# Patient Record
Sex: Male | Born: 1935 | Race: White | Hispanic: No
Health system: Southern US, Community
[De-identification: ages and names within clinical notes are randomized; demographics above are authoritative.]

## PROBLEM LIST (undated history)

## (undated) DIAGNOSIS — R2689 Other abnormalities of gait and mobility: Secondary | ICD-10-CM

## (undated) DIAGNOSIS — U071 COVID-19: Secondary | ICD-10-CM

## (undated) DIAGNOSIS — R627 Adult failure to thrive: Secondary | ICD-10-CM

## (undated) DIAGNOSIS — R339 Retention of urine, unspecified: Secondary | ICD-10-CM

## (undated) DIAGNOSIS — R1310 Dysphagia, unspecified: Secondary | ICD-10-CM

## (undated) DIAGNOSIS — M6281 Muscle weakness (generalized): Secondary | ICD-10-CM

## (undated) DIAGNOSIS — E119 Type 2 diabetes mellitus without complications: Secondary | ICD-10-CM

## (undated) DIAGNOSIS — R41841 Cognitive communication deficit: Secondary | ICD-10-CM

## (undated) DIAGNOSIS — J9601 Acute respiratory failure with hypoxia: Secondary | ICD-10-CM

## (undated) DIAGNOSIS — Q600 Renal agenesis, unilateral: Secondary | ICD-10-CM

## (undated) DIAGNOSIS — I10 Essential (primary) hypertension: Secondary | ICD-10-CM

---

## 2005-04-18 ENCOUNTER — Ambulatory Visit: Admission: RE | Admit: 2005-04-18 | Discharge: 2005-04-18 | Payer: Self-pay | Admitting: Ophthalmology

## 2005-08-10 ENCOUNTER — Ambulatory Visit (HOSPITAL_COMMUNITY): Admission: RE | Admit: 2005-08-10 | Discharge: 2005-08-10 | Payer: Self-pay | Admitting: Ophthalmology

## 2006-10-30 ENCOUNTER — Ambulatory Visit (HOSPITAL_COMMUNITY): Admission: RE | Admit: 2006-10-30 | Discharge: 2006-10-30 | Payer: Self-pay | Admitting: Ophthalmology

## 2006-10-30 ENCOUNTER — Encounter (INDEPENDENT_AMBULATORY_CARE_PROVIDER_SITE_OTHER): Payer: Self-pay | Admitting: Ophthalmology

## 2010-06-15 NOTE — Op Note (Signed)
NAME:  Joe [name], Joe [name]             ACCOUNT NO.:  [id number]   MEDICAL RECORD NO.:  [id number]          PATIENT TYPE:  AMB   LOCATION:  DFTL                         FACILITY:  [facility]   PHYSICIAN:  Alford Highland. Rankin, M.D.   DATE OF BIRTH:  [birth year]   DATE OF PROCEDURE:  10/30/2006  DATE OF DISCHARGE:                               OPERATIVE REPORT   SURGEON:  Jillyn Hidden A. Rankin, M.D.   PREOPERATIVE DIAGNOSIS:  Dislocated intraocular lens, posterior-  vitreous, left eye.   POSTOPERATIVE DIAGNOSIS:  Dislocated intraocular lens, posterior-  vitreous, left eye.   PROCEDURES PERFORMED:  1. Posterior vitrectomy, left eye.  2. Removal of dislocated implant-intraocular lens into the vitreous      cavity.  3. Insertion of anterior chamber intraocular lens, secondarily, left      eye.  4. Superior peripheral iridectomy.   LENS INFORMATION:  Lens model:  Alcon MTA4U0, power plus 18.  Serial #  [id number].   ANESTHESIA:  Local with monitored anesthesia control.   INDICATIONS FOR PROCEDURE:  The patient is a 77 year old man who is  aphakic with profound vision loss on the basis of unfocused images on  the retina from dislocated intraocular lens.  This is an attempt to  remove the posterior chamber intraocular lens which has inadequate  posterior capsular support in order to deliver definitive visual  restoration.  The patient understands the risks of anesthesia including  MI, stroke, death; also to the eye including, but not limited to, the  underlying condition and attempted surgical repair, hemorrhage,  infection, scarring, need for further surgery, no change in vision, loss  of vision, progressive disease despite intervention.  Appropriate signed  consent was obtained and the patient taken to the operating room.   DESCRIPTION OF PROCEDURE:  In the operating room, appropriate monitors  followed by mild sedation.  Marcaine, 0.75%, delivered, 5 mL  retrobulbar, followed by additional 5 mL;  the latter in the fashion of a  modified Darel Hong.  The left periocular region was sterilely prepped and  draped in the usual ophthalmic fashion.  A lid speculum applied.  A 25-  gauge trocar placed in the inferotemporal quadrant.  The infusion was  then turned on.  The superior trocar was applied.  Conjunctival peritomy  was then fashioned superiorly.  A grooved limbal incision was fashioned  with the crescent blade.  Then 25-gauge forceps were used with an  attachment  on the microscope visualization system to grasp the  intraocular lens which was dangling from the 6 o'clock capsule into the  posterior vitreous.  The lens was grasped, the optic was grasped and  brought to pars plana.  A 20-gauge anterior chamber is then opened  through the grooved limbal incision.  Then 20-gauge forceps were used to  grasp the intraocular lens.  The anterior chamber was deepened further  with Viscoat.  The grooved limbal incision was opened to allow egress of  the intraocular lens.  At this time, the anterior chamber lens was  placed into the anterior chamber under low infusion pressures.  This was  rotated in the  horizontal position and appropriately centered.  Superior  peripheral iridectomy was fashioned with Vannas scissors.  The limbus  was then closed with interrupted 10-0 nylon sutures.  Hemostasis was  spontaneous.  It must be noted that plugs have been placed in the  trocars during all these portions of the procedure.  Now the plugs are  removed and careful inspection found the retina is attached.  No  complications occurred.  High infusion pressures are necessary because  of the spontaneous scleral collapse because of orbital pressures.  These  high pressures were allowed.  Also the Viscoat was allowed to stay in  the anterior chamber to combat the scleral collapse from the high  orbital pressures.  The wound was secure.  The trocar was removed under  high infusion pressures.  The wounds were  secure.  The infusion removed.  Subconjunctival Decadron applied.  A sterile patch and Fox shield  applied.  The patient was taken to the short stay and will be discharged  home as an outpatient.      Alford Highland Rankin, M.D.  Electronically Signed     GAR/MEDQ  D:  10/30/2006  T:  10/31/2006  Job:  [id number]

## 2010-06-18 NOTE — Op Note (Signed)
NAME:  Joe [name], Joe [name]             ACCOUNT NO.:  [id number]   MEDICAL RECORD NO.:  [id number]          PATIENT TYPE:  AMB   LOCATION:  DFTL                         FACILITY:  [facility]   PHYSICIAN:  Alford Highland. Rankin, M.D.   DATE OF BIRTH:  02-Mar-1926   DATE OF PROCEDURE:  04/18/2005  DATE OF DISCHARGE:                                 OPERATIVE REPORT   PREOPERATIVE DIAGNOSES:  1.  Retained lens fragments, left eye.  2.  Aphakia, left eye.   POSTOPERATIVE DIAGNOSES:  1.  Retained lens fragments, left eye.  2.  Aphakia, left eye.   OPERATION PERFORMED:  1.  Posterior vitrectomy of the left eye.  2.  Removal of nonmagnetic foreign body, left eye, lens fragments.  3.  Insertion of posterior chamber intraocular lens secondary in the sulcus.      This is an Scientific laboratory technician.  Power is +22.0.   SURGEON:  Alford Highland. Rankin, M.D.   ANESTHESIA:  Local retrobulbar with monitored anesthesia control.   INDICATIONS FOR PROCEDURE:  The patient is a 77 year old man who had earlier  today complicated cataract surgery with Dr. Vonna Kotyk, and was found to have  ruptured capsule, dislocation of posterior chamber intraocular lens  fragments, numerous and sizable, as well as being aphakic.  The patient was  found to require surgical intervention. The patient understands the need for  the procedure to restore pseudophakic condition as well as to remove the  lens fragments because of risk of inflammation and side effects of glaucoma.  The patient understands the risks of anesthesia including the rare  occurrence of death but also to the eye including but not limited to  hemorrhage, infection, scarring, need for another surgery, no change in  vision, loss of vision and progression of disease despite intervention.  After appropriate signed consent was obtained, the patient was taken to the  operating room.   DESCRIPTION OF PROCEDURE:  In the operating room, appropriate monitoring was  followed by mild  sedation.  0.75% Marcaine was delivered 5 mL retrobulbar  followed by an additional 5 mL laterally in the fashion of modified Darel Hong.  The left periocular region was sterilely prepped and draped in the  usual ophthalmic fashion.  A lid speculum was applied.  Conjunctival  peritomy was fashioned superiorly and temporally.  The infusion secured in  the infratemporal quadrant.  Placement in the vitreous cavity verified  visually.  Superior sclerotomies then fashioned.  Microscope was placed in  position with the Biom attachment.  Core vitrectomy was then begun.  Numerous large fragments and cortical fragments were noted in the vitreous  cavity and these were removed without difficulty.  Scleral depression was  then used to trim the vitreous base and to confirm there were no retinal  holes or tears.  At this time the Kuglen hook was then used to retract the  inferior iris and to confirm that there were sizable remnants of the  posterior peripheral capsule.  Large peripheral capsule was noted  superiorly.  There was a dehiscence in the capsule between the 8 o'clock and  10 o'clock positions.   For this reason, the previous clear corneal incision which had been made and  closed previously with two previous nylon sutures was then used and enlarged  with keratome.  Sutures removed.  Through this area, the lens was inserted  into the anterior chamber and then this was placed into the appropriate  position and oriented from the 11 o'clock to the 7 o'clock position.  There  was excellent capsule support, excellent screen back.  There was no sign of  spiraling.   At this time the anterior chamber was gently further deepened again as  previous to the insertion of the lens with Viscoat. The clear corneal  incision was then closed with interrupted 10-0 nylon sutures.  These were  left unburied so as to facilitate removal later.  The wound was checked and found to be secure.  Superior sclerotomies  were  closed with 7-0 Vicryl.  The infusion removed and similarly closed.  Conjunctiva closed with 7-0 Vicryl.  Subconjunctival injection of antibiotic  and steroid applied.  A sterile patch and Fox shield were applied. The  patient tolerated the procedure well without complication.      Alford Highland Rankin, M.D.  Electronically Signed     GAR/MEDQ  D:  04/18/2005  T:  04/19/2005  Job:  [id number]   cc:   Ozzie Hoyle. Vonna Kotyk, MD  [address]  [place], [place] [address]   Allen Norris, M.D.  Fax: [phone]

## 2010-06-18 NOTE — Op Note (Signed)
NAME:  ELISAH, PARMER             ACCOUNT NO.:  [id number]   MEDICAL RECORD NO.:  [id number]          PATIENT TYPE:  AMB   LOCATION:  SDS                          FACILITY:  [facility]   PHYSICIAN:  Jillyn Hidden A. Rankin, M.D.   DATE OF BIRTH:  Jul 19, 1926   DATE OF PROCEDURE:  08/10/2005  DATE OF DISCHARGE:                                 OPERATIVE REPORT   PREOPERATIVE DIAGNOSIS:  Dislocated posterior chamber intraocular lens left  eye.   POSTOPERATIVE DIAGNOSES:  1.  Same.  2.  Aphakia left eye.   PROCEDURE:  1.  Post iridectomy -25 gauge left eye.  2.  Repositioning of intraocular lens into the sulcus with excellent support      from the 11-5:00 [place].   SURGEON:  Alford Highland. Rankin, M.D.   ANESTHESIA:  Local via retrobulbar.   INDICATIONS FOR PROCEDURE:  The patient is a 77 year old man who has vision  loss based on dislocated and posterior chamber intraocular lens.  The  patient understands this is an attempt to reposition.  He knows we will  potentially explant and remove it and place a different style lens based  upon the amount of support left in place.  He understands the risks of  anesthesia in regard to the loss of the eye including but not limited to  hemorrhage, scarring, need of other surgery, no change in vision, loss of  vision, disease by intervention. Informed consent was obtained.  The patient  was taken to the operating room.  In the operating room appropriate monitors  were placed following mild sedation. Next,  Marcaine 5 cc retrobulbar for  additional 5 cc laterally in the modified [employer]  The left ocular region  was sterilely prepped and draped in the usual fashion.  The speculum  applied.  Conjunctiva was noted ____25 gauge case.  Trocar was placed in the  upper quadrant.  Infusion turned on.  Superior trocar was applied.  Paracentesis incision made at 12:00 with a 20 gauge MBR blade.  Previous  vitreous had been removed.  25 gauge membrane forceps were then  used to  grasp the optic of the intraocular lens which was dislocated inferiorly to  bring it into the anterior chamber.  The anterior chamber deepened with  Provisc.  At this time 20 gauge forceps were then used across the anterior  chamber to reposition the lens and then to rotate it back into  position.  There was excellent capsular support inferior, intranasally and  infratemporally.  Central capsule support appeared to be all that was  missing.  Because of this the decision was made to rotate the lens into  sulcus.  Sulcus appeared to have excellent support.  There did not appear to  be any capsule dehiscence.  There is excellent spring back test laterally.   At this time visualization of the posterior sac was confirmed. There were no  retinal holes or tears.  No complications occurred.  Silk patch Fox shield  applied.  Subconjunctival dressing and Decadron was re-applied  subconjunctivally.  The patient was taken back to the short stay area  and  discharged to home as an outpatient.      Alford Highland Rankin, M.D.  Electronically Signed     GAR/MEDQ  D:  08/10/2005  T:  08/11/2005  Job:  [id number]

## 2010-11-11 LAB — PROTIME-INR
INR: 0.9
Prothrombin Time: 12.8

## 2010-11-11 LAB — BASIC METABOLIC PANEL
BUN: 15
CO2: 23
GFR calc non Af Amer: >60
Glucose, Bld: 115 — ABNORMAL HIGH
Sodium: 138

## 2010-11-11 LAB — CBC
Hemoglobin: 15.8
RDW: 14.5 — ABNORMAL HIGH
WBC: 6.1

## 2010-11-11 LAB — APTT: aPTT: 35

## 2011-03-10 DIAGNOSIS — E7800 Pure hypercholesterolemia, unspecified: Secondary | ICD-10-CM | POA: Diagnosis not present

## 2011-03-10 DIAGNOSIS — R7309 Other abnormal glucose: Secondary | ICD-10-CM | POA: Diagnosis not present

## 2011-03-10 DIAGNOSIS — I10 Essential (primary) hypertension: Secondary | ICD-10-CM | POA: Diagnosis not present

## 2011-06-08 DIAGNOSIS — M1710 Unilateral primary osteoarthritis, unspecified knee: Secondary | ICD-10-CM | POA: Diagnosis not present

## 2011-06-08 DIAGNOSIS — I10 Essential (primary) hypertension: Secondary | ICD-10-CM | POA: Diagnosis not present

## 2011-06-08 DIAGNOSIS — E7800 Pure hypercholesterolemia, unspecified: Secondary | ICD-10-CM | POA: Diagnosis not present

## 2011-06-08 DIAGNOSIS — R7309 Other abnormal glucose: Secondary | ICD-10-CM | POA: Diagnosis not present

## 2011-09-26 DIAGNOSIS — R7309 Other abnormal glucose: Secondary | ICD-10-CM | POA: Diagnosis not present

## 2011-09-26 DIAGNOSIS — E7800 Pure hypercholesterolemia, unspecified: Secondary | ICD-10-CM | POA: Diagnosis not present

## 2011-09-26 DIAGNOSIS — I10 Essential (primary) hypertension: Secondary | ICD-10-CM | POA: Diagnosis not present

## 2011-09-26 DIAGNOSIS — M1710 Unilateral primary osteoarthritis, unspecified knee: Secondary | ICD-10-CM | POA: Diagnosis not present

## 2012-01-09 DIAGNOSIS — E782 Mixed hyperlipidemia: Secondary | ICD-10-CM | POA: Diagnosis not present

## 2012-01-09 DIAGNOSIS — I10 Essential (primary) hypertension: Secondary | ICD-10-CM | POA: Diagnosis not present

## 2012-01-09 DIAGNOSIS — R7309 Other abnormal glucose: Secondary | ICD-10-CM | POA: Diagnosis not present

## 2012-01-09 DIAGNOSIS — Z79899 Other long term (current) drug therapy: Secondary | ICD-10-CM | POA: Diagnosis not present

## 2012-01-09 DIAGNOSIS — E785 Hyperlipidemia, unspecified: Secondary | ICD-10-CM | POA: Diagnosis not present

## 2012-01-09 DIAGNOSIS — E119 Type 2 diabetes mellitus without complications: Secondary | ICD-10-CM | POA: Diagnosis not present

## 2012-07-09 DIAGNOSIS — E785 Hyperlipidemia, unspecified: Secondary | ICD-10-CM | POA: Diagnosis not present

## 2012-07-09 DIAGNOSIS — I10 Essential (primary) hypertension: Secondary | ICD-10-CM | POA: Diagnosis not present

## 2012-07-09 DIAGNOSIS — E119 Type 2 diabetes mellitus without complications: Secondary | ICD-10-CM | POA: Diagnosis not present

## 2012-10-10 DIAGNOSIS — R748 Abnormal levels of other serum enzymes: Secondary | ICD-10-CM | POA: Diagnosis not present

## 2012-10-10 DIAGNOSIS — I10 Essential (primary) hypertension: Secondary | ICD-10-CM | POA: Diagnosis not present

## 2012-10-10 DIAGNOSIS — E119 Type 2 diabetes mellitus without complications: Secondary | ICD-10-CM | POA: Diagnosis not present

## 2012-10-10 DIAGNOSIS — E785 Hyperlipidemia, unspecified: Secondary | ICD-10-CM | POA: Diagnosis not present

## 2013-05-08 DIAGNOSIS — E785 Hyperlipidemia, unspecified: Secondary | ICD-10-CM | POA: Diagnosis not present

## 2013-05-08 DIAGNOSIS — I10 Essential (primary) hypertension: Secondary | ICD-10-CM | POA: Diagnosis not present

## 2013-05-08 DIAGNOSIS — E119 Type 2 diabetes mellitus without complications: Secondary | ICD-10-CM | POA: Diagnosis not present

## 2013-05-08 DIAGNOSIS — R748 Abnormal levels of other serum enzymes: Secondary | ICD-10-CM | POA: Diagnosis not present

## 2013-07-20 DIAGNOSIS — M129 Arthropathy, unspecified: Secondary | ICD-10-CM | POA: Diagnosis not present

## 2013-07-20 DIAGNOSIS — M1710 Unilateral primary osteoarthritis, unspecified knee: Secondary | ICD-10-CM | POA: Diagnosis not present

## 2013-07-20 DIAGNOSIS — M79609 Pain in unspecified limb: Secondary | ICD-10-CM | POA: Diagnosis not present

## 2013-07-20 DIAGNOSIS — M25869 Other specified joint disorders, unspecified knee: Secondary | ICD-10-CM | POA: Diagnosis not present

## 2013-07-20 DIAGNOSIS — M25569 Pain in unspecified knee: Secondary | ICD-10-CM | POA: Diagnosis not present

## 2013-07-20 DIAGNOSIS — M25559 Pain in unspecified hip: Secondary | ICD-10-CM | POA: Diagnosis not present

## 2013-07-20 DIAGNOSIS — M25669 Stiffness of unspecified knee, not elsewhere classified: Secondary | ICD-10-CM | POA: Diagnosis not present

## 2013-07-21 DIAGNOSIS — M25559 Pain in unspecified hip: Secondary | ICD-10-CM | POA: Diagnosis not present

## 2013-07-21 DIAGNOSIS — M25869 Other specified joint disorders, unspecified knee: Secondary | ICD-10-CM | POA: Diagnosis not present

## 2013-07-24 DIAGNOSIS — B029 Zoster without complications: Secondary | ICD-10-CM | POA: Diagnosis not present

## 2013-07-31 DIAGNOSIS — L02419 Cutaneous abscess of limb, unspecified: Secondary | ICD-10-CM | POA: Diagnosis not present

## 2013-08-05 DIAGNOSIS — E1129 Type 2 diabetes mellitus with other diabetic kidney complication: Secondary | ICD-10-CM | POA: Diagnosis present

## 2013-08-05 DIAGNOSIS — R918 Other nonspecific abnormal finding of lung field: Secondary | ICD-10-CM | POA: Diagnosis not present

## 2013-08-05 DIAGNOSIS — N179 Acute kidney failure, unspecified: Secondary | ICD-10-CM | POA: Diagnosis not present

## 2013-08-05 DIAGNOSIS — E119 Type 2 diabetes mellitus without complications: Secondary | ICD-10-CM | POA: Diagnosis not present

## 2013-08-05 DIAGNOSIS — J189 Pneumonia, unspecified organism: Secondary | ICD-10-CM | POA: Diagnosis not present

## 2013-08-05 DIAGNOSIS — Z6833 Body mass index (BMI) 33.0-33.9, adult: Secondary | ICD-10-CM | POA: Diagnosis not present

## 2013-08-05 DIAGNOSIS — R5381 Other malaise: Secondary | ICD-10-CM | POA: Diagnosis not present

## 2013-08-05 DIAGNOSIS — IMO0001 Reserved for inherently not codable concepts without codable children: Secondary | ICD-10-CM | POA: Diagnosis not present

## 2013-08-05 DIAGNOSIS — M19049 Primary osteoarthritis, unspecified hand: Secondary | ICD-10-CM | POA: Diagnosis present

## 2013-08-05 DIAGNOSIS — Q605 Renal hypoplasia, unspecified: Secondary | ICD-10-CM | POA: Diagnosis not present

## 2013-08-05 DIAGNOSIS — Z79899 Other long term (current) drug therapy: Secondary | ICD-10-CM | POA: Diagnosis not present

## 2013-08-05 DIAGNOSIS — R5383 Other fatigue: Secondary | ICD-10-CM | POA: Diagnosis not present

## 2013-08-05 DIAGNOSIS — N058 Unspecified nephritic syndrome with other morphologic changes: Secondary | ICD-10-CM | POA: Diagnosis present

## 2013-08-05 DIAGNOSIS — B028 Zoster with other complications: Secondary | ICD-10-CM | POA: Diagnosis not present

## 2013-08-05 DIAGNOSIS — E1165 Type 2 diabetes mellitus with hyperglycemia: Secondary | ICD-10-CM | POA: Diagnosis present

## 2013-08-05 DIAGNOSIS — Q602 Renal agenesis, unspecified: Secondary | ICD-10-CM | POA: Diagnosis not present

## 2013-08-05 DIAGNOSIS — E875 Hyperkalemia: Secondary | ICD-10-CM | POA: Diagnosis not present

## 2013-08-05 DIAGNOSIS — R404 Transient alteration of awareness: Secondary | ICD-10-CM | POA: Diagnosis not present

## 2013-08-05 DIAGNOSIS — Z7982 Long term (current) use of aspirin: Secondary | ICD-10-CM | POA: Diagnosis not present

## 2013-08-05 DIAGNOSIS — I10 Essential (primary) hypertension: Secondary | ICD-10-CM | POA: Diagnosis not present

## 2013-08-05 DIAGNOSIS — K219 Gastro-esophageal reflux disease without esophagitis: Secondary | ICD-10-CM | POA: Diagnosis present

## 2013-08-05 DIAGNOSIS — B029 Zoster without complications: Secondary | ICD-10-CM | POA: Diagnosis not present

## 2013-08-05 DIAGNOSIS — E7800 Pure hypercholesterolemia, unspecified: Secondary | ICD-10-CM | POA: Diagnosis present

## 2013-08-05 DIAGNOSIS — B372 Candidiasis of skin and nail: Secondary | ICD-10-CM | POA: Diagnosis not present

## 2013-08-05 DIAGNOSIS — R112 Nausea with vomiting, unspecified: Secondary | ICD-10-CM | POA: Diagnosis not present

## 2013-08-05 DIAGNOSIS — E785 Hyperlipidemia, unspecified: Secondary | ICD-10-CM | POA: Diagnosis not present

## 2013-08-08 DIAGNOSIS — L8991 Pressure ulcer of unspecified site, stage 1: Secondary | ICD-10-CM | POA: Diagnosis not present

## 2013-08-08 DIAGNOSIS — L89109 Pressure ulcer of unspecified part of back, unspecified stage: Secondary | ICD-10-CM | POA: Diagnosis not present

## 2013-08-08 DIAGNOSIS — E119 Type 2 diabetes mellitus without complications: Secondary | ICD-10-CM | POA: Diagnosis not present

## 2013-08-08 DIAGNOSIS — R5381 Other malaise: Secondary | ICD-10-CM | POA: Diagnosis not present

## 2013-08-08 DIAGNOSIS — R5383 Other fatigue: Secondary | ICD-10-CM | POA: Diagnosis not present

## 2013-08-08 DIAGNOSIS — B029 Zoster without complications: Secondary | ICD-10-CM | POA: Diagnosis not present

## 2013-08-08 DIAGNOSIS — I10 Essential (primary) hypertension: Secondary | ICD-10-CM | POA: Diagnosis not present

## 2013-08-12 DIAGNOSIS — Z6831 Body mass index (BMI) 31.0-31.9, adult: Secondary | ICD-10-CM | POA: Diagnosis not present

## 2013-08-12 DIAGNOSIS — N179 Acute kidney failure, unspecified: Secondary | ICD-10-CM | POA: Diagnosis not present

## 2013-08-12 DIAGNOSIS — R339 Retention of urine, unspecified: Secondary | ICD-10-CM | POA: Diagnosis not present

## 2013-08-12 DIAGNOSIS — N390 Urinary tract infection, site not specified: Secondary | ICD-10-CM | POA: Diagnosis not present

## 2013-08-12 DIAGNOSIS — E875 Hyperkalemia: Secondary | ICD-10-CM | POA: Diagnosis not present

## 2013-08-12 DIAGNOSIS — N318 Other neuromuscular dysfunction of bladder: Secondary | ICD-10-CM | POA: Diagnosis not present

## 2013-08-12 DIAGNOSIS — R338 Other retention of urine: Secondary | ICD-10-CM | POA: Diagnosis not present

## 2013-08-12 DIAGNOSIS — N402 Nodular prostate without lower urinary tract symptoms: Secondary | ICD-10-CM | POA: Diagnosis not present

## 2013-08-12 DIAGNOSIS — E860 Dehydration: Secondary | ICD-10-CM | POA: Diagnosis not present

## 2013-08-13 DIAGNOSIS — R5381 Other malaise: Secondary | ICD-10-CM | POA: Diagnosis not present

## 2013-08-13 DIAGNOSIS — E119 Type 2 diabetes mellitus without complications: Secondary | ICD-10-CM | POA: Diagnosis not present

## 2013-08-13 DIAGNOSIS — L8991 Pressure ulcer of unspecified site, stage 1: Secondary | ICD-10-CM | POA: Diagnosis not present

## 2013-08-13 DIAGNOSIS — B029 Zoster without complications: Secondary | ICD-10-CM | POA: Diagnosis not present

## 2013-08-13 DIAGNOSIS — I10 Essential (primary) hypertension: Secondary | ICD-10-CM | POA: Diagnosis not present

## 2013-08-13 DIAGNOSIS — L89109 Pressure ulcer of unspecified part of back, unspecified stage: Secondary | ICD-10-CM | POA: Diagnosis not present

## 2013-08-13 DIAGNOSIS — R5383 Other fatigue: Secondary | ICD-10-CM | POA: Diagnosis not present

## 2013-08-15 DIAGNOSIS — R5381 Other malaise: Secondary | ICD-10-CM | POA: Diagnosis not present

## 2013-08-15 DIAGNOSIS — L89109 Pressure ulcer of unspecified part of back, unspecified stage: Secondary | ICD-10-CM | POA: Diagnosis not present

## 2013-08-15 DIAGNOSIS — L8991 Pressure ulcer of unspecified site, stage 1: Secondary | ICD-10-CM | POA: Diagnosis not present

## 2013-08-15 DIAGNOSIS — E119 Type 2 diabetes mellitus without complications: Secondary | ICD-10-CM | POA: Diagnosis not present

## 2013-08-15 DIAGNOSIS — B029 Zoster without complications: Secondary | ICD-10-CM | POA: Diagnosis not present

## 2013-08-15 DIAGNOSIS — I10 Essential (primary) hypertension: Secondary | ICD-10-CM | POA: Diagnosis not present

## 2013-08-15 DIAGNOSIS — R5383 Other fatigue: Secondary | ICD-10-CM | POA: Diagnosis not present

## 2013-08-16 DIAGNOSIS — R5381 Other malaise: Secondary | ICD-10-CM | POA: Diagnosis not present

## 2013-08-16 DIAGNOSIS — L8991 Pressure ulcer of unspecified site, stage 1: Secondary | ICD-10-CM | POA: Diagnosis not present

## 2013-08-16 DIAGNOSIS — L89109 Pressure ulcer of unspecified part of back, unspecified stage: Secondary | ICD-10-CM | POA: Diagnosis not present

## 2013-08-16 DIAGNOSIS — B029 Zoster without complications: Secondary | ICD-10-CM | POA: Diagnosis not present

## 2013-08-16 DIAGNOSIS — I10 Essential (primary) hypertension: Secondary | ICD-10-CM | POA: Diagnosis not present

## 2013-08-16 DIAGNOSIS — E119 Type 2 diabetes mellitus without complications: Secondary | ICD-10-CM | POA: Diagnosis not present

## 2013-08-19 DIAGNOSIS — N138 Other obstructive and reflux uropathy: Secondary | ICD-10-CM | POA: Diagnosis not present

## 2013-08-19 DIAGNOSIS — R339 Retention of urine, unspecified: Secondary | ICD-10-CM | POA: Diagnosis not present

## 2013-08-19 DIAGNOSIS — N403 Nodular prostate with lower urinary tract symptoms: Secondary | ICD-10-CM | POA: Diagnosis not present

## 2013-08-21 DIAGNOSIS — I10 Essential (primary) hypertension: Secondary | ICD-10-CM | POA: Diagnosis not present

## 2013-08-21 DIAGNOSIS — B029 Zoster without complications: Secondary | ICD-10-CM | POA: Diagnosis not present

## 2013-08-21 DIAGNOSIS — R5381 Other malaise: Secondary | ICD-10-CM | POA: Diagnosis not present

## 2013-08-21 DIAGNOSIS — L89109 Pressure ulcer of unspecified part of back, unspecified stage: Secondary | ICD-10-CM | POA: Diagnosis not present

## 2013-08-21 DIAGNOSIS — R5383 Other fatigue: Secondary | ICD-10-CM | POA: Diagnosis not present

## 2013-08-21 DIAGNOSIS — L8991 Pressure ulcer of unspecified site, stage 1: Secondary | ICD-10-CM | POA: Diagnosis not present

## 2013-08-21 DIAGNOSIS — E119 Type 2 diabetes mellitus without complications: Secondary | ICD-10-CM | POA: Diagnosis not present

## 2013-08-23 DIAGNOSIS — I10 Essential (primary) hypertension: Secondary | ICD-10-CM | POA: Diagnosis not present

## 2013-08-23 DIAGNOSIS — B029 Zoster without complications: Secondary | ICD-10-CM | POA: Diagnosis not present

## 2013-08-23 DIAGNOSIS — E119 Type 2 diabetes mellitus without complications: Secondary | ICD-10-CM | POA: Diagnosis not present

## 2013-08-23 DIAGNOSIS — R5381 Other malaise: Secondary | ICD-10-CM | POA: Diagnosis not present

## 2013-08-23 DIAGNOSIS — L89109 Pressure ulcer of unspecified part of back, unspecified stage: Secondary | ICD-10-CM | POA: Diagnosis not present

## 2013-08-23 DIAGNOSIS — L8991 Pressure ulcer of unspecified site, stage 1: Secondary | ICD-10-CM | POA: Diagnosis not present

## 2013-08-23 DIAGNOSIS — R5383 Other fatigue: Secondary | ICD-10-CM | POA: Diagnosis not present

## 2013-08-27 DIAGNOSIS — N401 Enlarged prostate with lower urinary tract symptoms: Secondary | ICD-10-CM | POA: Diagnosis not present

## 2013-08-27 DIAGNOSIS — I10 Essential (primary) hypertension: Secondary | ICD-10-CM | POA: Diagnosis not present

## 2013-08-27 DIAGNOSIS — B029 Zoster without complications: Secondary | ICD-10-CM | POA: Diagnosis not present

## 2013-08-27 DIAGNOSIS — Z6832 Body mass index (BMI) 32.0-32.9, adult: Secondary | ICD-10-CM | POA: Diagnosis not present

## 2013-08-27 DIAGNOSIS — IMO0001 Reserved for inherently not codable concepts without codable children: Secondary | ICD-10-CM | POA: Diagnosis not present

## 2013-08-27 DIAGNOSIS — N138 Other obstructive and reflux uropathy: Secondary | ICD-10-CM | POA: Diagnosis not present

## 2013-08-27 DIAGNOSIS — N179 Acute kidney failure, unspecified: Secondary | ICD-10-CM | POA: Diagnosis not present

## 2013-08-28 DIAGNOSIS — L89109 Pressure ulcer of unspecified part of back, unspecified stage: Secondary | ICD-10-CM | POA: Diagnosis not present

## 2013-08-28 DIAGNOSIS — I10 Essential (primary) hypertension: Secondary | ICD-10-CM | POA: Diagnosis not present

## 2013-08-28 DIAGNOSIS — R5381 Other malaise: Secondary | ICD-10-CM | POA: Diagnosis not present

## 2013-08-28 DIAGNOSIS — E119 Type 2 diabetes mellitus without complications: Secondary | ICD-10-CM | POA: Diagnosis not present

## 2013-08-28 DIAGNOSIS — L8991 Pressure ulcer of unspecified site, stage 1: Secondary | ICD-10-CM | POA: Diagnosis not present

## 2013-08-28 DIAGNOSIS — B029 Zoster without complications: Secondary | ICD-10-CM | POA: Diagnosis not present

## 2013-09-05 DIAGNOSIS — E119 Type 2 diabetes mellitus without complications: Secondary | ICD-10-CM | POA: Diagnosis not present

## 2013-09-05 DIAGNOSIS — L89109 Pressure ulcer of unspecified part of back, unspecified stage: Secondary | ICD-10-CM | POA: Diagnosis not present

## 2013-09-05 DIAGNOSIS — B029 Zoster without complications: Secondary | ICD-10-CM | POA: Diagnosis not present

## 2013-09-05 DIAGNOSIS — I10 Essential (primary) hypertension: Secondary | ICD-10-CM | POA: Diagnosis not present

## 2013-09-05 DIAGNOSIS — R5381 Other malaise: Secondary | ICD-10-CM | POA: Diagnosis not present

## 2013-09-05 DIAGNOSIS — L8991 Pressure ulcer of unspecified site, stage 1: Secondary | ICD-10-CM | POA: Diagnosis not present

## 2013-09-11 DIAGNOSIS — L8991 Pressure ulcer of unspecified site, stage 1: Secondary | ICD-10-CM | POA: Diagnosis not present

## 2013-09-11 DIAGNOSIS — L89109 Pressure ulcer of unspecified part of back, unspecified stage: Secondary | ICD-10-CM | POA: Diagnosis not present

## 2013-09-11 DIAGNOSIS — I10 Essential (primary) hypertension: Secondary | ICD-10-CM | POA: Diagnosis not present

## 2013-09-11 DIAGNOSIS — B029 Zoster without complications: Secondary | ICD-10-CM | POA: Diagnosis not present

## 2013-09-11 DIAGNOSIS — R5381 Other malaise: Secondary | ICD-10-CM | POA: Diagnosis not present

## 2013-09-11 DIAGNOSIS — E119 Type 2 diabetes mellitus without complications: Secondary | ICD-10-CM | POA: Diagnosis not present

## 2013-09-26 DIAGNOSIS — E119 Type 2 diabetes mellitus without complications: Secondary | ICD-10-CM | POA: Diagnosis not present

## 2013-09-26 DIAGNOSIS — B0229 Other postherpetic nervous system involvement: Secondary | ICD-10-CM | POA: Diagnosis not present

## 2013-09-26 DIAGNOSIS — Z6832 Body mass index (BMI) 32.0-32.9, adult: Secondary | ICD-10-CM | POA: Diagnosis not present

## 2013-10-31 DIAGNOSIS — E785 Hyperlipidemia, unspecified: Secondary | ICD-10-CM | POA: Diagnosis not present

## 2013-10-31 DIAGNOSIS — E119 Type 2 diabetes mellitus without complications: Secondary | ICD-10-CM | POA: Diagnosis not present

## 2013-10-31 DIAGNOSIS — Z6832 Body mass index (BMI) 32.0-32.9, adult: Secondary | ICD-10-CM | POA: Diagnosis not present

## 2013-10-31 DIAGNOSIS — C44229 Squamous cell carcinoma of skin of left ear and external auricular canal: Secondary | ICD-10-CM | POA: Diagnosis not present

## 2013-10-31 DIAGNOSIS — I10 Essential (primary) hypertension: Secondary | ICD-10-CM | POA: Diagnosis not present

## 2013-10-31 DIAGNOSIS — N401 Enlarged prostate with lower urinary tract symptoms: Secondary | ICD-10-CM | POA: Diagnosis not present

## 2013-11-18 DIAGNOSIS — C44219 Basal cell carcinoma of skin of left ear and external auricular canal: Secondary | ICD-10-CM | POA: Diagnosis not present

## 2013-12-18 DIAGNOSIS — C44229 Squamous cell carcinoma of skin of left ear and external auricular canal: Secondary | ICD-10-CM | POA: Diagnosis not present

## 2014-03-19 DIAGNOSIS — N401 Enlarged prostate with lower urinary tract symptoms: Secondary | ICD-10-CM | POA: Diagnosis not present

## 2014-03-19 DIAGNOSIS — E119 Type 2 diabetes mellitus without complications: Secondary | ICD-10-CM | POA: Diagnosis not present

## 2014-03-19 DIAGNOSIS — E785 Hyperlipidemia, unspecified: Secondary | ICD-10-CM | POA: Diagnosis not present

## 2014-03-19 DIAGNOSIS — I10 Essential (primary) hypertension: Secondary | ICD-10-CM | POA: Diagnosis not present

## 2014-03-19 DIAGNOSIS — Z6834 Body mass index (BMI) 34.0-34.9, adult: Secondary | ICD-10-CM | POA: Diagnosis not present

## 2014-03-20 DIAGNOSIS — E119 Type 2 diabetes mellitus without complications: Secondary | ICD-10-CM | POA: Diagnosis not present

## 2014-09-15 DIAGNOSIS — I10 Essential (primary) hypertension: Secondary | ICD-10-CM | POA: Diagnosis not present

## 2014-09-15 DIAGNOSIS — E785 Hyperlipidemia, unspecified: Secondary | ICD-10-CM | POA: Diagnosis not present

## 2014-09-15 DIAGNOSIS — Z6835 Body mass index (BMI) 35.0-35.9, adult: Secondary | ICD-10-CM | POA: Diagnosis not present

## 2014-09-15 DIAGNOSIS — E119 Type 2 diabetes mellitus without complications: Secondary | ICD-10-CM | POA: Diagnosis not present

## 2014-09-15 DIAGNOSIS — N401 Enlarged prostate with lower urinary tract symptoms: Secondary | ICD-10-CM | POA: Diagnosis not present

## 2015-03-23 DIAGNOSIS — I10 Essential (primary) hypertension: Secondary | ICD-10-CM | POA: Diagnosis not present

## 2015-03-23 DIAGNOSIS — E785 Hyperlipidemia, unspecified: Secondary | ICD-10-CM | POA: Diagnosis not present

## 2015-03-23 DIAGNOSIS — N183 Chronic kidney disease, stage 3 (moderate): Secondary | ICD-10-CM | POA: Diagnosis not present

## 2015-03-23 DIAGNOSIS — N401 Enlarged prostate with lower urinary tract symptoms: Secondary | ICD-10-CM | POA: Diagnosis not present

## 2015-03-23 DIAGNOSIS — E1129 Type 2 diabetes mellitus with other diabetic kidney complication: Secondary | ICD-10-CM | POA: Diagnosis not present

## 2015-03-23 DIAGNOSIS — Z6835 Body mass index (BMI) 35.0-35.9, adult: Secondary | ICD-10-CM | POA: Diagnosis not present

## 2015-03-23 DIAGNOSIS — H906 Mixed conductive and sensorineural hearing loss, bilateral: Secondary | ICD-10-CM | POA: Diagnosis not present

## 2015-09-21 DIAGNOSIS — Z9989 Dependence on other enabling machines and devices: Secondary | ICD-10-CM | POA: Diagnosis not present

## 2015-09-21 DIAGNOSIS — N183 Chronic kidney disease, stage 3 (moderate): Secondary | ICD-10-CM | POA: Diagnosis not present

## 2015-09-21 DIAGNOSIS — N401 Enlarged prostate with lower urinary tract symptoms: Secondary | ICD-10-CM | POA: Diagnosis not present

## 2015-09-21 DIAGNOSIS — E1129 Type 2 diabetes mellitus with other diabetic kidney complication: Secondary | ICD-10-CM | POA: Diagnosis not present

## 2015-09-21 DIAGNOSIS — M1711 Unilateral primary osteoarthritis, right knee: Secondary | ICD-10-CM | POA: Diagnosis not present

## 2015-09-21 DIAGNOSIS — H906 Mixed conductive and sensorineural hearing loss, bilateral: Secondary | ICD-10-CM | POA: Diagnosis not present

## 2015-09-21 DIAGNOSIS — I10 Essential (primary) hypertension: Secondary | ICD-10-CM | POA: Diagnosis not present

## 2015-09-21 DIAGNOSIS — E785 Hyperlipidemia, unspecified: Secondary | ICD-10-CM | POA: Diagnosis not present

## 2016-03-17 DIAGNOSIS — Z1389 Encounter for screening for other disorder: Secondary | ICD-10-CM | POA: Diagnosis not present

## 2016-03-17 DIAGNOSIS — E119 Type 2 diabetes mellitus without complications: Secondary | ICD-10-CM | POA: Diagnosis not present

## 2016-03-17 DIAGNOSIS — E785 Hyperlipidemia, unspecified: Secondary | ICD-10-CM | POA: Diagnosis not present

## 2016-03-17 DIAGNOSIS — Z6834 Body mass index (BMI) 34.0-34.9, adult: Secondary | ICD-10-CM | POA: Diagnosis not present

## 2016-03-17 DIAGNOSIS — E1129 Type 2 diabetes mellitus with other diabetic kidney complication: Secondary | ICD-10-CM | POA: Diagnosis not present

## 2016-03-17 DIAGNOSIS — Z9989 Dependence on other enabling machines and devices: Secondary | ICD-10-CM | POA: Diagnosis not present

## 2016-03-17 DIAGNOSIS — I10 Essential (primary) hypertension: Secondary | ICD-10-CM | POA: Diagnosis not present

## 2016-03-17 DIAGNOSIS — H906 Mixed conductive and sensorineural hearing loss, bilateral: Secondary | ICD-10-CM | POA: Diagnosis not present

## 2016-03-17 DIAGNOSIS — L989 Disorder of the skin and subcutaneous tissue, unspecified: Secondary | ICD-10-CM | POA: Diagnosis not present

## 2016-06-15 DIAGNOSIS — Z6834 Body mass index (BMI) 34.0-34.9, adult: Secondary | ICD-10-CM | POA: Diagnosis not present

## 2016-06-15 DIAGNOSIS — H906 Mixed conductive and sensorineural hearing loss, bilateral: Secondary | ICD-10-CM | POA: Diagnosis not present

## 2016-06-15 DIAGNOSIS — L989 Disorder of the skin and subcutaneous tissue, unspecified: Secondary | ICD-10-CM | POA: Diagnosis not present

## 2016-06-15 DIAGNOSIS — Z1389 Encounter for screening for other disorder: Secondary | ICD-10-CM | POA: Diagnosis not present

## 2016-06-15 DIAGNOSIS — E1129 Type 2 diabetes mellitus with other diabetic kidney complication: Secondary | ICD-10-CM | POA: Diagnosis not present

## 2016-06-15 DIAGNOSIS — Z9989 Dependence on other enabling machines and devices: Secondary | ICD-10-CM | POA: Diagnosis not present

## 2016-06-15 DIAGNOSIS — E785 Hyperlipidemia, unspecified: Secondary | ICD-10-CM | POA: Diagnosis not present

## 2016-06-15 DIAGNOSIS — I10 Essential (primary) hypertension: Secondary | ICD-10-CM | POA: Diagnosis not present

## 2016-08-26 ENCOUNTER — Other Ambulatory Visit: Payer: Self-pay

## 2016-09-21 DIAGNOSIS — H906 Mixed conductive and sensorineural hearing loss, bilateral: Secondary | ICD-10-CM | POA: Diagnosis not present

## 2016-09-21 DIAGNOSIS — Z9989 Dependence on other enabling machines and devices: Secondary | ICD-10-CM | POA: Diagnosis not present

## 2016-09-21 DIAGNOSIS — Z6833 Body mass index (BMI) 33.0-33.9, adult: Secondary | ICD-10-CM | POA: Diagnosis not present

## 2016-09-21 DIAGNOSIS — I10 Essential (primary) hypertension: Secondary | ICD-10-CM | POA: Diagnosis not present

## 2016-09-21 DIAGNOSIS — E785 Hyperlipidemia, unspecified: Secondary | ICD-10-CM | POA: Diagnosis not present

## 2016-09-21 DIAGNOSIS — E1129 Type 2 diabetes mellitus with other diabetic kidney complication: Secondary | ICD-10-CM | POA: Diagnosis not present

## 2017-12-19 DIAGNOSIS — R7989 Other specified abnormal findings of blood chemistry: Secondary | ICD-10-CM

## 2017-12-19 DIAGNOSIS — R410 Disorientation, unspecified: Secondary | ICD-10-CM

## 2017-12-19 DIAGNOSIS — N390 Urinary tract infection, site not specified: Secondary | ICD-10-CM | POA: Diagnosis not present

## 2017-12-19 DIAGNOSIS — I10 Essential (primary) hypertension: Secondary | ICD-10-CM

## 2017-12-19 DIAGNOSIS — D72829 Elevated white blood cell count, unspecified: Secondary | ICD-10-CM

## 2017-12-19 DIAGNOSIS — R531 Weakness: Secondary | ICD-10-CM

## 2017-12-19 DIAGNOSIS — E1165 Type 2 diabetes mellitus with hyperglycemia: Secondary | ICD-10-CM

## 2017-12-20 DIAGNOSIS — I10 Essential (primary) hypertension: Secondary | ICD-10-CM | POA: Diagnosis not present

## 2017-12-20 DIAGNOSIS — R410 Disorientation, unspecified: Secondary | ICD-10-CM | POA: Diagnosis not present

## 2017-12-20 DIAGNOSIS — N390 Urinary tract infection, site not specified: Secondary | ICD-10-CM | POA: Diagnosis not present

## 2017-12-20 DIAGNOSIS — E1165 Type 2 diabetes mellitus with hyperglycemia: Secondary | ICD-10-CM | POA: Diagnosis not present

## 2017-12-21 DIAGNOSIS — E1165 Type 2 diabetes mellitus with hyperglycemia: Secondary | ICD-10-CM | POA: Diagnosis not present

## 2017-12-21 DIAGNOSIS — N390 Urinary tract infection, site not specified: Secondary | ICD-10-CM | POA: Diagnosis not present

## 2017-12-21 DIAGNOSIS — R410 Disorientation, unspecified: Secondary | ICD-10-CM | POA: Diagnosis not present

## 2017-12-21 DIAGNOSIS — I10 Essential (primary) hypertension: Secondary | ICD-10-CM | POA: Diagnosis not present

## 2020-03-06 ENCOUNTER — Other Ambulatory Visit: Payer: Self-pay

## 2020-03-06 ENCOUNTER — Encounter (HOSPITAL_COMMUNITY): Payer: Self-pay | Admitting: *Deleted

## 2020-03-06 ENCOUNTER — Inpatient Hospital Stay (HOSPITAL_COMMUNITY)
Admission: EM | Admit: 2020-03-06 | Discharge: 2020-03-17 | DRG: 871 | Disposition: A | Discharge status: Skilled Nursing Facility | Payer: Medicare Other | Attending: Internal Medicine | Admitting: Internal Medicine

## 2020-03-06 ENCOUNTER — Emergency Department (HOSPITAL_COMMUNITY): Payer: Medicare Other

## 2020-03-06 DIAGNOSIS — R6520 Severe sepsis without septic shock: Secondary | ICD-10-CM

## 2020-03-06 DIAGNOSIS — I10 Essential (primary) hypertension: Secondary | ICD-10-CM | POA: Diagnosis present

## 2020-03-06 DIAGNOSIS — Z66 Do not resuscitate: Secondary | ICD-10-CM | POA: Diagnosis present

## 2020-03-06 DIAGNOSIS — Z79899 Other long term (current) drug therapy: Secondary | ICD-10-CM

## 2020-03-06 DIAGNOSIS — A4189 Other specified sepsis: Principal | ICD-10-CM | POA: Diagnosis present

## 2020-03-06 DIAGNOSIS — J1282 Pneumonia due to coronavirus disease 2019: Secondary | ICD-10-CM | POA: Diagnosis present

## 2020-03-06 DIAGNOSIS — Z7982 Long term (current) use of aspirin: Secondary | ICD-10-CM

## 2020-03-06 DIAGNOSIS — A419 Sepsis, unspecified organism: Secondary | ICD-10-CM

## 2020-03-06 DIAGNOSIS — R54 Age-related physical debility: Secondary | ICD-10-CM | POA: Diagnosis present

## 2020-03-06 DIAGNOSIS — Z6830 Body mass index (BMI) 30.0-30.9, adult: Secondary | ICD-10-CM | POA: Diagnosis not present

## 2020-03-06 DIAGNOSIS — R7989 Other specified abnormal findings of blood chemistry: Secondary | ICD-10-CM | POA: Diagnosis present

## 2020-03-06 DIAGNOSIS — R627 Adult failure to thrive: Secondary | ICD-10-CM | POA: Diagnosis present

## 2020-03-06 DIAGNOSIS — U071 COVID-19: Secondary | ICD-10-CM

## 2020-03-06 DIAGNOSIS — E669 Obesity, unspecified: Secondary | ICD-10-CM | POA: Diagnosis present

## 2020-03-06 DIAGNOSIS — J159 Unspecified bacterial pneumonia: Secondary | ICD-10-CM | POA: Diagnosis present

## 2020-03-06 DIAGNOSIS — Z7189 Other specified counseling: Secondary | ICD-10-CM | POA: Diagnosis not present

## 2020-03-06 DIAGNOSIS — N179 Acute kidney failure, unspecified: Secondary | ICD-10-CM

## 2020-03-06 DIAGNOSIS — R6521 Severe sepsis with septic shock: Secondary | ICD-10-CM

## 2020-03-06 DIAGNOSIS — J9601 Acute respiratory failure with hypoxia: Secondary | ICD-10-CM

## 2020-03-06 DIAGNOSIS — Q600 Renal agenesis, unilateral: Secondary | ICD-10-CM | POA: Diagnosis not present

## 2020-03-06 DIAGNOSIS — G9341 Metabolic encephalopathy: Secondary | ICD-10-CM | POA: Diagnosis present

## 2020-03-06 DIAGNOSIS — R0602 Shortness of breath: Secondary | ICD-10-CM

## 2020-03-06 DIAGNOSIS — E1110 Type 2 diabetes mellitus with ketoacidosis without coma: Secondary | ICD-10-CM

## 2020-03-06 DIAGNOSIS — E1111 Type 2 diabetes mellitus with ketoacidosis with coma: Secondary | ICD-10-CM | POA: Diagnosis present

## 2020-03-06 DIAGNOSIS — R197 Diarrhea, unspecified: Secondary | ICD-10-CM | POA: Diagnosis present

## 2020-03-06 DIAGNOSIS — Z7984 Long term (current) use of oral hypoglycemic drugs: Secondary | ICD-10-CM

## 2020-03-06 DIAGNOSIS — Z515 Encounter for palliative care: Secondary | ICD-10-CM | POA: Diagnosis not present

## 2020-03-06 HISTORY — DX: Renal agenesis, unilateral: Q60.0

## 2020-03-06 HISTORY — DX: Type 2 diabetes mellitus without complications: E11.9

## 2020-03-06 HISTORY — DX: Essential (primary) hypertension: I10

## 2020-03-06 LAB — COMPREHENSIVE METABOLIC PANEL
ALT: 33 U/L (ref 0–44)
AST: 46 U/L — ABNORMAL HIGH (ref 15–41)
Albumin: 3 g/dL — ABNORMAL LOW (ref 3.5–5.0)
Alkaline Phosphatase: 114 U/L (ref 38–126)
Anion gap: 21 — ABNORMAL HIGH (ref 5–15)
BUN: 20 mg/dL (ref 8–23)
CO2: 16 mmol/L — ABNORMAL LOW (ref 22–32)
Calcium: 9.1 mg/dL (ref 8.9–10.3)
Chloride: 98 mmol/L (ref 98–111)
Creatinine, Ser: 1.59 mg/dL — ABNORMAL HIGH (ref 0.61–1.24)
GFR, Estimated: 41 mL/min — ABNORMAL LOW (ref >60)
Glucose, Bld: 450 mg/dL — ABNORMAL HIGH (ref 70–99)
Potassium: 4.9 mmol/L (ref 3.5–5.1)
Sodium: 135 mmol/L (ref 135–145)
Total Bilirubin: 0.7 mg/dL (ref 0.3–1.2)
Total Protein: 6.7 g/dL (ref 6.5–8.1)

## 2020-03-06 LAB — URINALYSIS, ROUTINE W REFLEX MICROSCOPIC
Bacteria, UA: NONE SEEN
Bilirubin Urine: NEGATIVE
Glucose, UA: >=500 mg/dL — AB
Hgb urine dipstick: NEGATIVE
Ketones, ur: 5 mg/dL — AB
Leukocytes,Ua: NEGATIVE
Nitrite: NEGATIVE
Protein, ur: 100 mg/dL — AB
Specific Gravity, Urine: 1.027 (ref 1.005–1.030)
pH: 5 (ref 5.0–8.0)

## 2020-03-06 LAB — BASIC METABOLIC PANEL
Anion gap: 14 (ref 5–15)
Anion gap: 12 (ref 5–15)
Anion gap: 12 (ref 5–15)
BUN: 19 mg/dL (ref 8–23)
BUN: 18 mg/dL (ref 8–23)
BUN: 20 mg/dL (ref 8–23)
CO2: 17 mmol/L — ABNORMAL LOW (ref 22–32)
CO2: 19 mmol/L — ABNORMAL LOW (ref 22–32)
CO2: 18 mmol/L — ABNORMAL LOW (ref 22–32)
Calcium: 7.6 mg/dL — ABNORMAL LOW (ref 8.9–10.3)
Calcium: 8.3 mg/dL — ABNORMAL LOW (ref 8.9–10.3)
Calcium: 8.2 mg/dL — ABNORMAL LOW (ref 8.9–10.3)
Chloride: 105 mmol/L (ref 98–111)
Chloride: 109 mmol/L (ref 98–111)
Chloride: 110 mmol/L (ref 98–111)
Creatinine, Ser: 1.52 mg/dL — ABNORMAL HIGH (ref 0.61–1.24)
Creatinine, Ser: 1.39 mg/dL — ABNORMAL HIGH (ref 0.61–1.24)
Creatinine, Ser: 1.32 mg/dL — ABNORMAL HIGH (ref 0.61–1.24)
GFR, Estimated: 43 mL/min — ABNORMAL LOW (ref >60)
GFR, Estimated: 48 mL/min — ABNORMAL LOW (ref >60)
GFR, Estimated: 51 mL/min — ABNORMAL LOW (ref >60)
Glucose, Bld: 383 mg/dL — ABNORMAL HIGH (ref 70–99)
Glucose, Bld: 165 mg/dL — ABNORMAL HIGH (ref 70–99)
Glucose, Bld: 122 mg/dL — ABNORMAL HIGH (ref 70–99)
Potassium: 5 mmol/L (ref 3.5–5.1)
Potassium: 4.3 mmol/L (ref 3.5–5.1)
Potassium: 4.7 mmol/L (ref 3.5–5.1)
Sodium: 136 mmol/L (ref 135–145)
Sodium: 140 mmol/L (ref 135–145)
Sodium: 140 mmol/L (ref 135–145)

## 2020-03-06 LAB — CBG MONITORING, ED
Glucose-Capillary: 349 mg/dL — ABNORMAL HIGH (ref 70–99)
Glucose-Capillary: 320 mg/dL — ABNORMAL HIGH (ref 70–99)
Glucose-Capillary: 182 mg/dL — ABNORMAL HIGH (ref 70–99)
Glucose-Capillary: 158 mg/dL — ABNORMAL HIGH (ref 70–99)
Glucose-Capillary: 163 mg/dL — ABNORMAL HIGH (ref 70–99)

## 2020-03-06 LAB — I-STAT VENOUS BLOOD GAS, ED
Acid-base deficit: 9 mmol/L — ABNORMAL HIGH (ref 0.0–2.0)
Bicarbonate: 16.6 mmol/L — ABNORMAL LOW (ref 20.0–28.0)
Calcium, Ion: 1.05 mmol/L — ABNORMAL LOW (ref 1.15–1.40)
HCT: 38 % — ABNORMAL LOW (ref 39.0–52.0)
Hemoglobin: 12.9 g/dL — ABNORMAL LOW (ref 13.0–17.0)
O2 Saturation: 100 %
Potassium: 4.8 mmol/L (ref 3.5–5.1)
Sodium: 137 mmol/L (ref 135–145)
TCO2: 18 mmol/L — ABNORMAL LOW (ref 22–32)
pCO2, Ven: 33.7 mmHg — ABNORMAL LOW (ref 44.0–60.0)
pH, Ven: 7.3 (ref 7.250–7.430)
pO2, Ven: 201 mmHg — ABNORMAL HIGH (ref 32.0–45.0)

## 2020-03-06 LAB — LACTIC ACID, PLASMA
Lactic Acid, Venous: 7.1 mmol/L (ref 0.5–1.9)
Lactic Acid, Venous: 6.7 mmol/L (ref 0.5–1.9)
Lactic Acid, Venous: 4.5 mmol/L (ref 0.5–1.9)
Lactic Acid, Venous: 3.8 mmol/L (ref 0.5–1.9)

## 2020-03-06 LAB — CBC WITH DIFFERENTIAL/PLATELET
Abs Immature Granulocytes: 0.06 10*3/uL (ref 0.00–0.07)
Basophils Absolute: 0 10*3/uL (ref 0.0–0.1)
Basophils Relative: 0 %
Eosinophils Absolute: 0 10*3/uL (ref 0.0–0.5)
Eosinophils Relative: 0 %
HCT: 43.8 % (ref 39.0–52.0)
Hemoglobin: 14.3 g/dL (ref 13.0–17.0)
Immature Granulocytes: 0 %
Lymphocytes Relative: 3 %
Lymphs Abs: 0.5 10*3/uL — ABNORMAL LOW (ref 0.7–4.0)
MCH: 30.7 pg (ref 26.0–34.0)
MCHC: 32.6 g/dL (ref 30.0–36.0)
MCV: 94 fL (ref 80.0–100.0)
Monocytes Absolute: 0.8 10*3/uL (ref 0.1–1.0)
Monocytes Relative: 6 %
Neutro Abs: 12.5 10*3/uL — ABNORMAL HIGH (ref 1.7–7.7)
Neutrophils Relative %: 91 %
Platelets: 275 10*3/uL (ref 150–400)
RBC: 4.66 MIL/uL (ref 4.22–5.81)
RDW: 14.1 % (ref 11.5–15.5)
WBC: 13.9 10*3/uL — ABNORMAL HIGH (ref 4.0–10.5)
nRBC: 0 % (ref 0.0–0.2)

## 2020-03-06 LAB — BETA-HYDROXYBUTYRIC ACID
Beta-Hydroxybutyric Acid: 1.21 mmol/L — ABNORMAL HIGH (ref 0.05–0.27)
Beta-Hydroxybutyric Acid: 0.27 mmol/L (ref 0.05–0.27)

## 2020-03-06 LAB — PROCALCITONIN: Procalcitonin: 4.05 ng/mL

## 2020-03-06 LAB — HEMOGLOBIN A1C
Hgb A1c MFr Bld: 8.7 % — ABNORMAL HIGH (ref 4.8–5.6)
Mean Plasma Glucose: 202.99 mg/dL

## 2020-03-06 LAB — GLUCOSE, CAPILLARY
Glucose-Capillary: 155 mg/dL — ABNORMAL HIGH (ref 70–99)
Glucose-Capillary: 113 mg/dL — ABNORMAL HIGH (ref 70–99)
Glucose-Capillary: 140 mg/dL — ABNORMAL HIGH (ref 70–99)
Glucose-Capillary: 170 mg/dL — ABNORMAL HIGH (ref 70–99)
Glucose-Capillary: 188 mg/dL — ABNORMAL HIGH (ref 70–99)
Glucose-Capillary: 147 mg/dL — ABNORMAL HIGH (ref 70–99)
Glucose-Capillary: 133 mg/dL — ABNORMAL HIGH (ref 70–99)

## 2020-03-06 LAB — FERRITIN: Ferritin: 176 ng/mL (ref 24–336)

## 2020-03-06 LAB — LACTATE DEHYDROGENASE: LDH: 236 U/L — ABNORMAL HIGH (ref 98–192)

## 2020-03-06 LAB — PROTIME-INR
INR: 1.1 (ref 0.8–1.2)
Prothrombin Time: 13.6 seconds (ref 11.4–15.2)

## 2020-03-06 LAB — D-DIMER, QUANTITATIVE: D-Dimer, Quant: 13.49 ug/mL-FEU — ABNORMAL HIGH (ref 0.00–0.50)

## 2020-03-06 LAB — TROPONIN I (HIGH SENSITIVITY): Troponin I (High Sensitivity): 37 ng/L — ABNORMAL HIGH (ref <18)

## 2020-03-06 LAB — APTT: aPTT: 34 seconds (ref 24–36)

## 2020-03-06 LAB — C-REACTIVE PROTEIN: CRP: 11.2 mg/dL — ABNORMAL HIGH (ref <1.0)

## 2020-03-06 LAB — SARS CORONAVIRUS 2 BY RT PCR (HOSPITAL ORDER, PERFORMED IN ~~LOC~~ HOSPITAL LAB): SARS Coronavirus 2: POSITIVE — AB

## 2020-03-06 LAB — HEPATITIS B SURFACE ANTIGEN: Hepatitis B Surface Ag: NONREACTIVE

## 2020-03-06 LAB — BRAIN NATRIURETIC PEPTIDE: B Natriuretic Peptide: 87.3 pg/mL (ref 0.0–100.0)

## 2020-03-06 MED ORDER — FAMOTIDINE IN NACL 20-0.9 MG/50ML-% IV SOLN
20.0000 mg | Freq: Two times a day (BID) | INTRAVENOUS | Status: DC
  Administered 2020-03-06 – 2020-03-10 (×9): 20 mg via INTRAVENOUS
  Filled 2020-03-06 (×11): qty 50

## 2020-03-06 MED ORDER — GUAIFENESIN-DM 100-10 MG/5ML PO SYRP: 10.0000 mL | ORAL_SOLUTION | ORAL | Status: DC | PRN

## 2020-03-06 MED ORDER — SODIUM CHLORIDE 0.9 % IV SOLN
2.0000 g | Freq: Once | INTRAVENOUS | Status: AC
  Administered 2020-03-06: 2 g via INTRAVENOUS
  Filled 2020-03-06: qty 2

## 2020-03-06 MED ORDER — DEXTROSE 50 % IV SOLN: 0.0000 mL | INTRAVENOUS | Status: DC | PRN

## 2020-03-06 MED ORDER — METOPROLOL SUCCINATE ER 50 MG PO TB24
50.0000 mg | ORAL_TABLET | Freq: Every day | ORAL | Status: DC
  Administered 2020-03-06 – 2020-03-07 (×2): 50 mg via ORAL
  Filled 2020-03-06 (×2): qty 1

## 2020-03-06 MED ORDER — ACETAMINOPHEN 650 MG RE SUPP: 650.0000 mg | Freq: Four times a day (QID) | RECTAL | Status: DC | PRN

## 2020-03-06 MED ORDER — ASPIRIN EC 81 MG PO TBEC
81.0000 mg | DELAYED_RELEASE_TABLET | Freq: Every day | ORAL | Status: DC
  Administered 2020-03-07 – 2020-03-17 (×9): 81 mg via ORAL
  Filled 2020-03-06 (×10): qty 1

## 2020-03-06 MED ORDER — POTASSIUM CHLORIDE 10 MEQ/100ML IV SOLN
10.0000 meq | INTRAVENOUS | Status: AC
  Administered 2020-03-06 (×2): 10 meq via INTRAVENOUS
  Filled 2020-03-06 (×2): qty 100

## 2020-03-06 MED ORDER — ONDANSETRON HCL 4 MG PO TABS: 4.0000 mg | ORAL_TABLET | Freq: Four times a day (QID) | ORAL | Status: DC | PRN

## 2020-03-06 MED ORDER — ASCORBIC ACID 500 MG PO TABS
500.0000 mg | ORAL_TABLET | Freq: Every day | ORAL | Status: DC
  Administered 2020-03-07 – 2020-03-17 (×9): 500 mg via ORAL
  Filled 2020-03-06 (×10): qty 1

## 2020-03-06 MED ORDER — SODIUM CHLORIDE 0.9 % IV BOLUS (SEPSIS)
1000.0000 mL | Freq: Once | INTRAVENOUS | Status: AC
  Administered 2020-03-06: 1000 mL via INTRAVENOUS

## 2020-03-06 MED ORDER — SODIUM CHLORIDE 0.9 % IV SOLN
500.0000 mg | INTRAVENOUS | Status: DC
  Administered 2020-03-06 – 2020-03-08 (×3): 500 mg via INTRAVENOUS
  Filled 2020-03-06 (×4): qty 500

## 2020-03-06 MED ORDER — PREDNISONE 20 MG PO TABS: 50.0000 mg | ORAL_TABLET | Freq: Every day | ORAL | Status: DC

## 2020-03-06 MED ORDER — ENOXAPARIN SODIUM 40 MG/0.4ML ~~LOC~~ SOLN
40.0000 mg | SUBCUTANEOUS | Status: DC
  Administered 2020-03-06 – 2020-03-14 (×9): 40 mg via SUBCUTANEOUS
  Filled 2020-03-06 (×9): qty 0.4

## 2020-03-06 MED ORDER — METHYLPREDNISOLONE SODIUM SUCC 125 MG IJ SOLR
50.0000 mg | Freq: Two times a day (BID) | INTRAMUSCULAR | Status: DC
  Administered 2020-03-06 – 2020-03-07 (×3): 50 mg via INTRAVENOUS
  Filled 2020-03-06 (×3): qty 2

## 2020-03-06 MED ORDER — ZINC SULFATE 220 (50 ZN) MG PO CAPS
220.0000 mg | ORAL_CAPSULE | Freq: Every day | ORAL | Status: DC
  Administered 2020-03-07 – 2020-03-17 (×9): 220 mg via ORAL
  Filled 2020-03-06 (×10): qty 1

## 2020-03-06 MED ORDER — METRONIDAZOLE IN NACL 5-0.79 MG/ML-% IV SOLN
500.0000 mg | Freq: Once | INTRAVENOUS | Status: AC
  Administered 2020-03-06: 500 mg via INTRAVENOUS
  Filled 2020-03-06: qty 100

## 2020-03-06 MED ORDER — TOCILIZUMAB 400 MG/20ML IV SOLN
810.0000 mg | Freq: Once | INTRAVENOUS | Status: AC
  Administered 2020-03-06: 810 mg via INTRAVENOUS
  Filled 2020-03-06: qty 40.5

## 2020-03-06 MED ORDER — SODIUM CHLORIDE 0.9 % IV SOLN
100.0000 mg | Freq: Every day | INTRAVENOUS | Status: AC
  Administered 2020-03-07 – 2020-03-10 (×4): 100 mg via INTRAVENOUS
  Filled 2020-03-06 (×4): qty 20

## 2020-03-06 MED ORDER — ACETAMINOPHEN 325 MG PO TABS
650.0000 mg | ORAL_TABLET | Freq: Four times a day (QID) | ORAL | Status: DC | PRN
  Administered 2020-03-10: 650 mg via ORAL
  Filled 2020-03-06 (×2): qty 2

## 2020-03-06 MED ORDER — CEFTRIAXONE SODIUM 1 G IJ SOLR
1.0000 g | INTRAMUSCULAR | Status: AC
  Administered 2020-03-06 – 2020-03-10 (×5): 1 g via INTRAVENOUS
  Filled 2020-03-06 (×2): qty 10
  Filled 2020-03-06: qty 1
  Filled 2020-03-06 (×3): qty 10

## 2020-03-06 MED ORDER — SODIUM CHLORIDE 0.9 % IV SOLN: 500.0000 mg | INTRAVENOUS | Status: DC

## 2020-03-06 MED ORDER — ONDANSETRON HCL 4 MG/2ML IJ SOLN: 4.0000 mg | Freq: Four times a day (QID) | INTRAMUSCULAR | Status: DC | PRN

## 2020-03-06 MED ORDER — SODIUM CHLORIDE 0.9 % IV SOLN
200.0000 mg | Freq: Once | INTRAVENOUS | Status: AC
  Administered 2020-03-06: 200 mg via INTRAVENOUS
  Filled 2020-03-06: qty 200

## 2020-03-06 MED ORDER — METHYLPREDNISOLONE SODIUM SUCC 125 MG IJ SOLR
125.0000 mg | Freq: Once | INTRAMUSCULAR | Status: AC
  Administered 2020-03-06: 125 mg via INTRAVENOUS
  Filled 2020-03-06: qty 2

## 2020-03-06 MED ORDER — VANCOMYCIN HCL IN DEXTROSE 1-5 GM/200ML-% IV SOLN
1000.0000 mg | Freq: Once | INTRAVENOUS | Status: DC
  Filled 2020-03-06: qty 200

## 2020-03-06 MED ORDER — VANCOMYCIN HCL 2000 MG/400ML IV SOLN
2000.0000 mg | Freq: Once | INTRAVENOUS | Status: AC
  Administered 2020-03-06: 2000 mg via INTRAVENOUS
  Filled 2020-03-06: qty 400

## 2020-03-06 MED ORDER — INSULIN REGULAR(HUMAN) IN NACL 100-0.9 UT/100ML-% IV SOLN
INTRAVENOUS | Status: DC
  Administered 2020-03-06: 17 [IU]/h via INTRAVENOUS
  Administered 2020-03-06: 4.4 [IU]/h via INTRAVENOUS
  Filled 2020-03-06 (×2): qty 100

## 2020-03-06 MED ORDER — SODIUM CHLORIDE 0.9% FLUSH
3.0000 mL | Freq: Two times a day (BID) | INTRAVENOUS | Status: DC
  Administered 2020-03-06 – 2020-03-17 (×18): 3 mL via INTRAVENOUS

## 2020-03-06 MED ORDER — ONDANSETRON HCL 4 MG/2ML IJ SOLN
4.0000 mg | Freq: Once | INTRAMUSCULAR | Status: AC
  Administered 2020-03-06: 4 mg via INTRAVENOUS
  Filled 2020-03-06: qty 2

## 2020-03-06 MED ORDER — INSULIN ASPART 100 UNIT/ML ~~LOC~~ SOLN
0.0000 [IU] | Freq: Three times a day (TID) | SUBCUTANEOUS | Status: DC
  Administered 2020-03-07: 3 [IU] via SUBCUTANEOUS
  Administered 2020-03-07: 5 [IU] via SUBCUTANEOUS
  Administered 2020-03-07: 3 [IU] via SUBCUTANEOUS
  Administered 2020-03-08: 5 [IU] via SUBCUTANEOUS
  Administered 2020-03-08: 3 [IU] via SUBCUTANEOUS
  Administered 2020-03-08 – 2020-03-09 (×2): 5 [IU] via SUBCUTANEOUS
  Administered 2020-03-09 – 2020-03-10 (×2): 3 [IU] via SUBCUTANEOUS
  Administered 2020-03-10 – 2020-03-11 (×2): 2 [IU] via SUBCUTANEOUS
  Administered 2020-03-12: 5 [IU] via SUBCUTANEOUS
  Administered 2020-03-12 – 2020-03-14 (×5): 3 [IU] via SUBCUTANEOUS
  Administered 2020-03-14 – 2020-03-15 (×3): 5 [IU] via SUBCUTANEOUS
  Administered 2020-03-15: 3 [IU] via SUBCUTANEOUS
  Administered 2020-03-16: 5 [IU] via SUBCUTANEOUS
  Administered 2020-03-16 – 2020-03-17 (×3): 3 [IU] via SUBCUTANEOUS

## 2020-03-06 MED ORDER — SODIUM CHLORIDE 0.9 % IV SOLN: 8.0000 mg/kg | Freq: Once | INTRAVENOUS | Status: DC

## 2020-03-06 MED ORDER — SODIUM CHLORIDE 0.9 % IV SOLN: INTRAVENOUS | Status: DC

## 2020-03-06 MED ORDER — DEXTROSE IN LACTATED RINGERS 5 % IV SOLN: INTRAVENOUS | Status: DC

## 2020-03-06 MED ORDER — ALBUTEROL SULFATE HFA 108 (90 BASE) MCG/ACT IN AERS
2.0000 | INHALATION_SPRAY | Freq: Four times a day (QID) | RESPIRATORY_TRACT | Status: DC
  Administered 2020-03-06 – 2020-03-17 (×37): 2 via RESPIRATORY_TRACT
  Filled 2020-03-06 (×3): qty 6.7

## 2020-03-06 MED ORDER — ACETAMINOPHEN 650 MG RE SUPP
650.0000 mg | Freq: Once | RECTAL | Status: AC
  Administered 2020-03-06: 650 mg via RECTAL
  Filled 2020-03-06: qty 1

## 2020-03-06 MED ORDER — INSULIN DETEMIR 100 UNIT/ML ~~LOC~~ SOLN
8.0000 [IU] | Freq: Two times a day (BID) | SUBCUTANEOUS | Status: DC
  Administered 2020-03-06 – 2020-03-10 (×8): 8 [IU] via SUBCUTANEOUS
  Filled 2020-03-06 (×12): qty 0.08

## 2020-03-06 MED ORDER — CALCIUM GLUCONATE-NACL 1-0.675 GM/50ML-% IV SOLN
1.0000 g | Freq: Once | INTRAVENOUS | Status: AC
  Administered 2020-03-06: 1000 mg via INTRAVENOUS
  Filled 2020-03-06: qty 50

## 2020-03-06 NOTE — ED Notes (Signed)
Call to 2W 

## 2020-03-06 NOTE — ED Notes (Signed)
Pt continuously pulling at lines, putting legs in between railings, removing monitoring equipment and oxygen. Mittens placed. Brief saturated with urine, cleaned and changed.

## 2020-03-06 NOTE — ED Notes (Signed)
MD contacted regarding transition orders as [facility] is closed.

## 2020-03-06 NOTE — ED Triage Notes (Signed)
Pt brought to ED via EMS from home. C/o emesis, diarrhea, abd pain x 2 days. Per family-- patient confused, alert and oriented x4 at baseline. EMS reports pt was 82% on room air, RR 48, bilateral crackles; 15L NRB applied and patient increased to 92%. No COVID vaccines.   EMS v/s: 432 CBG 125 HR Sinus tach 102 axillary  Daughter, Ann: [phone]

## 2020-03-06 NOTE — ED Provider Notes (Signed)
[facility] EMERGENCY DEPARTMENT Provider Note  CSN: [id number] Arrival date & time: 03/06/20 0344  Chief Complaint(s) No chief complaint on file. ED Triage Notes Virgina Jock, RN (Registered Nurse) . [facility] . [facility] Date of Service: 03/06/2020 3:46 AM . . Signed   Pt brought to ED via EMS from home. C/o emesis, diarrhea, abd pain x 2 days. Per family-- patient confused, alert and oriented x4 at baseline. EMS reports pt was 82% on room air, RR 48, bilateral crackles; 15L NRB applied and patient increased to 92%. No COVID vaccines.   EMS v/s: 432 CBG 125 HR Sinus tach 102 axillary      HPI Joe [name] is a 87 y.o. male presented to the emergency department for generalized fatigue, fevers,, nausea/vomiting, and altered mental status.  Patient is coming from home.  Unaccompanied by family.  Remainder of history, ROS, and physical exam limited due to patient's condition (altered mental status). Additional information was obtained from EMS.   Level V Caveat.  I called and spoke to the daughter, [name], who reported that the patient and her began having URI symptoms last week.  The patient had been taking Coricidin and appeared to be improving however yesterday he began having nausea and vomiting.  This morning around 1:30 AM the patient went to go to the bathroom and had difficulty standing due to his generalized weakness and fatigue.  Family attempted to assist him to the bathroom but patient collapsed.  He was caught before hitting the ground but slowly lowered.  No head trauma.  No loss of consciousness.  Patient was noted to be confused.  Daughter confirmed that patient is a DNR/DNI.  HPI  Past Medical History Hypertension Diabetes  Home Medication(s) Prior to Admission medications   Not on File  Metformin Glimepiride Lisinopril Metoprolol                                                                                                                                    Past Surgical History Cholecystectomy Family History No family history on file.  Social History   Allergies Patient has no allergy information on record.  Review of Systems Review of Systems  Unable to perform ROS: Mental status change  Allergic/Immunologic: Positive for food allergies.    Physical Exam Vital Signs  I have reviewed the triage vital signs BP (!) 166/81   Pulse (!) 128   Temp (!) 102.7 F (39.3 C) (Rectal)   Resp (!) 32   Wt 97.5 kg   SpO2 94%   Physical Exam Vitals reviewed.  Constitutional:      General: He is in acute distress.     Appearance: He is well-developed and well-nourished. He is ill-appearing.  HENT:     Head: Normocephalic and atraumatic.     Nose: Nose normal.  Eyes:     General: No scleral icterus.  Right eye: No discharge.        Left eye: No discharge.     Extraocular Movements: EOM normal.     Conjunctiva/sclera: Conjunctivae normal.     Pupils: Pupils are equal, round, and reactive to light.  Cardiovascular:     Rate and Rhythm: Regular rhythm. Tachycardia present.     Heart sounds: No murmur heard. No friction rub. No gallop.   Pulmonary:     Effort: Tachypnea present. No respiratory distress.     Breath sounds: No stridor. Examination of the left-middle field reveals rales. Examination of the left-lower field reveals rales. Rales present.  Abdominal:     General: There is no distension.     Palpations: Abdomen is soft.     Tenderness: There is no abdominal tenderness.  Musculoskeletal:        General: No tenderness or edema.     Cervical back: Normal range of motion and neck supple.  Skin:    General: Skin is warm and dry.     Findings: No erythema or rash.  Neurological:     Mental Status: He is alert. He is disoriented and confused.  Psychiatric:        Mood and Affect: Mood and affect normal.     ED Results and Treatments Labs (all labs ordered are listed, but only abnormal  results are displayed) Labs Reviewed  URINALYSIS, ROUTINE W REFLEX MICROSCOPIC - Abnormal; Notable for the following components:      Result Value   Glucose, UA >=500 (*)    Ketones, ur 5 (*)    Protein, ur 100 (*)    All other components within normal limits  SARS CORONAVIRUS 2 BY RT PCR (HOSPITAL ORDER, [facility])  CULTURE, BLOOD (ROUTINE X 2)  CULTURE, BLOOD (ROUTINE X 2)  URINE CULTURE  LACTIC ACID, PLASMA  LACTIC ACID, PLASMA  COMPREHENSIVE METABOLIC PANEL  CBC WITH DIFFERENTIAL/PLATELET  PROTIME-INR  APTT                                                                                                                         EKG  EKG Interpretation  Date/Time:  Friday March 06 2020 03:58:32 EST Ventricular Rate:  127 PR Interval:    QRS Duration: 94 QT Interval:  292 QTC Calculation: 425 R Axis:   -74 Text Interpretation: Sinus tachycardia Left anterior fascicular block Anterior infarct, old Confirmed by Addison Lank [phone]) on 03/06/2020 4:32:29 AM      Radiology DG Chest Port 1 View  Result Date: 03/06/2020 CLINICAL DATA:  Questionable sepsis EXAM: PORTABLE CHEST 1 VIEW COMPARISON:  10/30/2006 FINDINGS: Infiltrate at the left lung base. Accentuation of markings at the right base which may be atelectasis in the setting of low lung volumes. Cardiomegaly which is stable. No pulmonary edema, effusion, or pneumothorax. IMPRESSION: Low volume chest with infiltrate at the left base. Electronically Signed   By: Monte Fantasia M.D.   On: 03/06/2020  04:13    Pertinent labs & imaging results that were available during my care of the patient were reviewed by me and considered in my medical decision making (see chart for details).  Medications Ordered in ED Medications  sodium chloride 0.9 % bolus 1,000 mL (has no administration in time range)  0.9 %  sodium chloride infusion (has no administration in time range)  ceFEPIme (MAXIPIME) 2 g in sodium  chloride 0.9 % 100 mL IVPB (has no administration in time range)  metroNIDAZOLE (FLAGYL) IVPB 500 mg (has no administration in time range)  vancomycin (VANCOREADY) IVPB 2000 mg/400 mL (has no administration in time range)                                                                                                                                    Procedures .1-3 Lead EKG Interpretation Performed by: Fatima Blank, MD Authorized by: Fatima Blank, MD     Interpretation: abnormal     ECG rate:  135   ECG rate assessment: tachycardic     Rhythm: sinus tachycardia     Ectopy: none     Conduction: normal   .Critical Care Performed by: Fatima Blank, MD Authorized by: Fatima Blank, MD   Critical care provider statement:    Critical care time (minutes):  60   Critical care was necessary to treat or prevent imminent or life-threatening deterioration of the following conditions:  Sepsis, shock and metabolic crisis   Critical care was time spent personally by me on the following activities:  Discussions with consultants, evaluation of patient's response to treatment, examination of patient, ordering and performing treatments and interventions, ordering and review of laboratory studies, ordering and review of radiographic studies, pulse oximetry, re-evaluation of patient's condition, obtaining history from patient or surrogate and review of old charts    (including critical care time)  Medical Decision Making / ED Course I have reviewed the nursing notes for this encounter and the patient's prior records (if available in EHR or on provided paperwork).   Joe [name] was evaluated in Emergency Department on 03/06/2020 for the symptoms described in the history of present illness. He was evaluated in the context of the global COVID-19 pandemic, which necessitated consideration that the patient might be at risk for infection with the SARS-CoV-2 virus that  causes COVID-19. Institutional protocols and algorithms that pertain to the evaluation of patients at risk for COVID-19 are in a state of rapid change based on information released by regulatory bodies including the CDC and federal and state organizations. These policies and algorithms were followed during the patient's care in the ED.   Clinical Course as of 03/06/20 0727  Fri Mar 06, 2020  0432 Patient noted be febrile and tachycardic. Hypertensive. Code sepsis initiated. Started on IV fluids and empiric antibiotics. Chest x-ray notable for left lower lobe pneumonia.  Possible aspiration. Abdomen nontender nondistended.  UA without evidence of infection.  Rest of the labs currently pending.   [PC]  0450 Patient blood pressure trending down now with systolics in 0000000.  Additional IV fluids added to cover for 30 cc/kg. [PC]  [id number] CBC notable for leukocytosis [PC]  [id number] COVID + [PC]  [id number] Patient's lactic acid was 7.  Already receiving 30 cc/kg of IV fluids.  CMP notable for hyperglycemia and anion gap. Gap could be attributed to the lactic acid but given the level of hyperglycemia, Endo tool started.  Patient was also started on steroids and remdesivir for Covid infection. [PC]  [phone] Case discussed with hospitalist service who will admit patient to the stepdown unit. [PC]  [phone] Daughter updated [PC]    Clinical Course User Index [PC] Lameshia Hypolite, Grayce Sessions, MD     Final Clinical Impression(s) / ED Diagnoses Final diagnoses:  Septic shock ([place])  Diabetic ketoacidosis with coma associated with type 2 diabetes mellitus ([place])  COVID-19 virus infection      This chart was dictated using voice recognition software.  Despite best efforts to proofread,  errors can occur which can change the documentation meaning.   Fatima Blank, MD 03/06/20 [phone]

## 2020-03-06 NOTE — Progress Notes (Signed)
Following for Code Sepsis  

## 2020-03-06 NOTE — ED Notes (Signed)
Report given to 2W

## 2020-03-06 NOTE — ED Notes (Signed)
2W unable to take report

## 2020-03-06 NOTE — H&P (Addendum)
History and Physical    Joe [name] [id number] DOB: 1926-11-13 DOA: 03/06/2020  Referring MD/NP/PA: Harrold Donath, MD PCP: Patient, No Pcp Per  Patient coming from: Home with his daughter via EMS  Chief Complaint: Weakness with nausea and vomiting  I have personally briefly reviewed patient's old medical records in [place]   HPI: Joe [name] is a 87 y.o. male with medical history significant of hypertension, diabetes mellitus type II, and solitary kidney who presents with complaints of weakness with nausea.  History is mostly obtained from the patient daughter over the phone as he not clear on why he is here at the hospital.  At baseline he is able to get around with use of a walker and lives with his daughter.  Patient normally is able to perform most of his ADLs without assistance and usually is alone throughout the day.  Over the last week they had cough and have been been taking over-the-counter cold medicine.  She thought that he was getting better, but have been complaining of upset stomach for the last 2 days or so.  Reported to have subjective fever, nausea, vomiting, diarrhea.  He had been sleeping more and had tried to get up several times to take his regular medications, but was unable to keep anything down.  Family also reported that he was confused and is normally alert and oriented to person place and time.  Yesterday evening patient was unable to get up due to being so weak and family called EMS.  EMS noted patient to be tachycardic with heart rates into the 120s with axillary temperature of 102 F.  O2 saturation noted to be 82% on room air for which he was placed on a nonrebreather at 15 L with increase in O2 saturations to 92%.  ED Course: Upon admission into the emergency department patient was noted to be febrile up to 102.7 F, pulse 1 21-138, respirations 18-37, blood pressures 103/56-183/89, and O2 saturations currently maintained on 6 L of nasal  cannula oxygen.  Labs significant for WBC 13.9, CO2 16, BUN 20, creatinine 1.59, glucose 450, anion gap 21, and lactic acid 7.1.  Chest x-ray showed low lung volumes with concerns of a left lower lobe infiltrate.  Urinalysis was positive for glucose and ketones.  Venous was pH 7.3.  Sepsis protocol has been initiated patient had been given full fluid bolus.  Blood and urine cultures were obtained.  Patient had received acetaminophen 600 mg rectally, Solu-Medrol IV, potassium chloride 20 mEq IV remdesivir, cefepime, metronidazole, and vancomycin.  He had also been started on hyperglycemic protocol with insulin drip  Review of Systems  Unable to perform ROS: Mental status change  Constitutional: Positive for malaise/fatigue.  Respiratory: Positive for cough.   Gastrointestinal: Positive for abdominal pain, diarrhea, nausea and vomiting.  Musculoskeletal: Negative for joint pain.    Past Medical History:  Diagnosis Date  . Diabetes mellitus without complication ([place])   . Hypertension   . Solitary kidney, congenital     History reviewed. No pertinent surgical history.   has no history on file for tobacco use, alcohol use, and drug use.  No Known Allergies  History reviewed. No pertinent family history.  Prior to Admission medications   Medication Sig Start Date End Date Taking? Authorizing Provider  aspirin 81 MG EC tablet Take 81 mg by mouth daily. Swallow whole.   Yes [provider]  Cyanocobalamin (VITAMIN B12) 1000 MCG TBCR Take 1 tablet by mouth every  evening.   Yes [provider]  docusate sodium (COLACE) 100 MG capsule Take 100 mg by mouth every evening.   Yes [provider]  glipiZIDE (GLUCOTROL) 5 MG tablet Take 5 mg by mouth every morning. 01/09/20  Yes [provider]  lisinopril (ZESTRIL) 5 MG tablet Take 5 mg by mouth every evening. 01/09/20  Yes [provider]  metFORMIN (GLUCOPHAGE) 1000 MG tablet Take 1,000 mg by mouth 2 (two)  times daily. 02/26/20  Yes [provider]  metoprolol succinate (TOPROL-XL) 50 MG 24 hr tablet Take 50 mg by mouth daily. 01/09/20  Yes [provider]    Physical Exam:  Constitutional: Elderly male who appears acutely ill Vitals:   03/06/20 0600 03/06/20 0620 03/06/20 0644 03/06/20 0653  BP: (!) 161/123 123/72    Pulse: (!) 138 (!) 126 (!) 126   Resp: (!) 21 (!) 27 18   Temp:   99.1 F (37.3 C)   TempSrc:      SpO2: 92% 91% 90%   Weight:      Height:    5\' 10"  (1.778 m)   Eyes: PERRL, lids and conjunctivae normal ENMT: Mucous membranes are dry. Posterior pharynx clear of any exudate or lesions.  Neck: normal, supple, no masses, no thyromegaly Respiratory: clear to auscultation bilaterally, no wheezing, no crackles. Normal respiratory effort. No accessory muscle use.  Cardiovascular: Tachycardia, no murmurs / rubs / gallops. No extremity edema. 2+ pedal pulses. No carotid bruits.  Abdomen: no tenderness, no masses palpated. No hepatosplenomegaly. Bowel sounds positive.  Musculoskeletal: no clubbing / cyanosis. No joint deformity upper and lower extremities. Good ROM, no contractures. Normal muscle tone.  Skin: Pallor present. Neurologic: CN 2-12 grossly intact. Sensation intact, DTR normal. Strength 5/5 in all 4.  Psychiatric: Confused. Alert and oriented x person.    Labs on Admission: I have personally reviewed following labs and imaging studies  CBC: Recent Labs  Lab 03/06/20 0358 03/06/20 0659  WBC 13.9*  --   NEUTROABS 12.5*  --   HGB 14.3 12.9*  HCT 43.8 38.0*  MCV 94.0  --   PLT 275  --    Basic Metabolic Panel: Recent Labs  Lab 03/06/20 0358 03/06/20 0659  NA 135 137  K 4.9 4.8  CL 98  --   CO2 16*  --   GLUCOSE 450*  --   BUN 20  --   CREATININE 1.59*  --   CALCIUM 9.1  --    GFR: Estimated Creatinine Clearance: 35.4 mL/min (A) (by C-G formula based on SCr of 1.59 mg/dL (H)). Liver Function Tests: Recent Labs  Lab  03/06/20 0358  AST 46*  ALT 33  ALKPHOS 114  BILITOT 0.7  PROT 6.7  ALBUMIN 3.0*   No results for input(s): LIPASE, AMYLASE in the last 168 hours. No results for input(s): AMMONIA in the last 168 hours. Coagulation Profile: Recent Labs  Lab 03/06/20 0358  INR 1.1   Cardiac Enzymes: No results for input(s): CKTOTAL, CKMB, CKMBINDEX, TROPONINI in the last 168 hours. BNP (last 3 results) No results for input(s): PROBNP in the last 8760 hours. HbA1C: No results for input(s): HGBA1C in the last 72 hours. CBG: Recent Labs  Lab 03/06/20 0620  GLUCAP 349*   Lipid Profile: No results for input(s): CHOL, HDL, LDLCALC, TRIG, CHOLHDL, LDLDIRECT in the last 72 hours. Thyroid Function Tests: No results for input(s): TSH, T4TOTAL, FREET4, T3FREE, THYROIDAB in the last 72 hours. Anemia Panel: No results  for input(s): VITAMINB12, FOLATE, FERRITIN, TIBC, IRON, RETICCTPCT in the last 72 hours. Urine analysis:    Component Value Date/Time   COLORURINE YELLOW 03/06/2020 0358   APPEARANCEUR CLEAR 03/06/2020 0358   LABSPEC 1.027 03/06/2020 0358   PHURINE 5.0 03/06/2020 0358   GLUCOSEU >=500 (A) 03/06/2020 0358   HGBUR NEGATIVE 03/06/2020 0358   BILIRUBINUR NEGATIVE 03/06/2020 0358   KETONESUR 5 (A) 03/06/2020 0358   PROTEINUR 100 (A) 03/06/2020 0358   NITRITE NEGATIVE 03/06/2020 0358   LEUKOCYTESUR NEGATIVE 03/06/2020 0358   Sepsis Labs: Recent Results (from the past 240 hour(s))  SARS Coronavirus 2 by RT PCR (hospital order, performed in [place] hospital lab) Nasopharyngeal Nasopharyngeal Swab     Status: Abnormal   Collection Time: 03/06/20  3:58 AM   Specimen: Nasopharyngeal Swab  Result Value Ref Range Status   SARS Coronavirus 2 POSITIVE (A) NEGATIVE Final    Comment: RESULT CALLED TO, READ BACK BY AND VERIFIED WITH: K GIBSON RN 03/06/20 0538 JDW (NOTE) SARS-CoV-2 target nucleic acids are DETECTED  SARS-CoV-2 RNA is generally detectable in upper respiratory specimens   during the acute phase of infection.  Positive results are indicative  of the presence of the identified virus, but do not rule out bacterial infection or co-infection with other pathogens not detected by the test.  Clinical correlation with patient history and  other diagnostic information is necessary to determine patient infection status.  The expected result is negative.  Fact Sheet for Patients:   StrictlyIdeas.no   Fact Sheet for Healthcare Providers:   BankingDealers.co.za    This test is not yet approved or cleared by the [place] FDA and  has been authorized for detection and/or diagnosis of SARS-CoV-2 by FDA under an Emergency Use Authorization (EUA).  This EUA will remain in effect (meaning this test can  be used) for the duration of  the COVID-19 declaration under Section 564(b)(1) of the Act, 21 U.S.C. section 360-bbb-3(b)(1), unless the authorization is terminated or revoked sooner.  Performed at [facility], [place] [address] [place], [place] [address]      Radiological Exams on Admission: DG Chest Port 1 View  Result Date: 03/06/2020 CLINICAL DATA:  Questionable sepsis EXAM: PORTABLE CHEST 1 VIEW COMPARISON:  10/30/2006 FINDINGS: Infiltrate at the left lung base. Accentuation of markings at the right base which may be atelectasis in the setting of low lung volumes. Cardiomegaly which is stable. No pulmonary edema, effusion, or pneumothorax. IMPRESSION: Low volume chest with infiltrate at the left base. Electronically Signed   By: Monte Fantasia M.D.   On: 03/06/2020 04:13    EKG: Independently reviewed.  Sinus tachycardia 127 beats per  Assessment/Plan  Acute respiratory failure with hypoxia secondary to pneumonia due to COVID-19 virus: Patient presented with complaints of nausea, vomiting, diarrhea, and abdominal pain.  O2 saturations noted to be as low as 82% on room air currently on 6 L of nasal cannula  oxygen maintain O2 saturations reported to be unvaccinated and was found to be Covid positive.  Chest x-ray significant for left lower lobe infiltrate.  Patient has been given Solu-Medrol IV and remdesivir.  Based off initial presentation patient appears to have a severe case of COVID-19. -Admit to a progressive bed -COVID-19 order set utilized with Airborne and contact precautions -Continuous pulse oximetry with nasal cannula oxygen maintain O2 saturation greater than 92% -Check inflammatory markers and monitor daily -Continue remdesivir -Continue Solu-Medrol IV low-dose -Actemra IV x1  -Albuterol inhaler -Vitamin  C and zinc once able to swallow safely -Antitussives as needed  Severe Sepsis: Acute.  Patient noted to be febrile up to 102.7 F with tachycardia, tachypnea, WBC 13.9, and initial lactic acid 7.1.  Source thought likely to be COVID-19 with concern for pneumonia possibly bacterial superimposed on viral.  Patient had initially been given a full fluid bolus, vancomycin, cefepime, and metronidazole. -Follow-up blood and urine cultures -Adjusted antibiotics to Rocephin and azithromycin  DKA, type II: On admission patient with glucose 450 CO2 16, anion gap 21, and initial venous pH 7.3.  Urinalysis was positive for glucose and ketones.  Based off patient's initial presentation suspect more so a mild DKA picture as opposed to HHS. -Hyperglycemic protocol -Continue insulin drip and changed IV fluids 200 mL/h -Monitor for gap closure we will transition to subcutaneous insulin -Hold glipizide and Metformin  Acute metabolic encephalopathy: Patient noted to be acutely altered, but normally reported to be alert and oriented x3.  Suspect secondary to above as patient appears to be coming more lucid after IV fluids. -Aspiration precautions -Neurochecks  Acute kidney injury: Patient presented with creatinine elevated up to 1.59 with BUN 20.  Baseline creatinine previously had been within normal  limits back in 2008 at 0.88, but no more recent creatinine available.  Specific gravity was at the upper limit of normal on urinalysis given concern for a prerenal cause in addition to reports of vomiting and diarrhea. -Strict intake and output -Hold nephrotoxic agents -IV fluids as seen above   Essential hypertension: Blood pressures currently stable.  Home blood pressure medications include lisinopril nightly and metoprolol 50 mg daily. -Held lisinopril due to suspected AKI -Restart metoprolol once able to tolerate p.o.  Generalized weakness: Reported unable to get up and ambulate due to weakness at home. -PT to eval and treat  Solitary kidney: Patient was reportedly born with a congenital solitary kidney.  DNR/DNI present on admission  GI prophylaxis: Pepcid  DVT prophylaxis: Lovenox Code Status: DNR/DNI Family Communication: Daughter updated over the phone Disposition Plan: To be determined Consults called: None Admission status: Inpatient, require more than 2 midnight stay due to acute respiratory failure  Norval Morton MD [facility]   If 7PM-7AM, please contact night-coverage   03/06/2020, 7:38 AM

## 2020-03-06 NOTE — ED Notes (Signed)
Please note that pt is fully vaccinated, he had moderna.

## 2020-03-06 NOTE — ED Notes (Signed)
2W, RN to call me back for report

## 2020-03-06 NOTE — Progress Notes (Addendum)
Inpatient Diabetes Program Recommendations  AACE/ADA: New Consensus Statement on Inpatient Glycemic Control (2015)  Target Ranges:  Prepandial:   less than 140 mg/dL      Peak postprandial:   less than 180 mg/dL (1-2 hours)      Critically ill patients:  140 - 180 mg/dL   Lab Results  Component Value Date   GLUCAP 163 (H) 03/06/2020   HGBA1C 8.7 (H) 03/06/2020    Review of Glycemic Control  Diabetes history: type 2 Outpatient Diabetes medications: glipizide 5 mg daily, Metformin 1000 mg BID Current orders for Inpatient glycemic control: IV insulin  Inpatient Diabetes Program Recommendations:   Recommend transitioning off IV insulin with Levemir 8 units BID and add Novolog MODERATE correction scale every 4 hours while NPO, then TID and HS scale when eating. Titrate dosages as needed.   Harvel Ricks RN BSN CDE Diabetes Coordinator Pager: [phone]  8am-5pm

## 2020-03-07 ENCOUNTER — Inpatient Hospital Stay (HOSPITAL_COMMUNITY): Payer: Medicare Other

## 2020-03-07 DIAGNOSIS — E1111 Type 2 diabetes mellitus with ketoacidosis with coma: Secondary | ICD-10-CM | POA: Diagnosis not present

## 2020-03-07 DIAGNOSIS — N179 Acute kidney failure, unspecified: Secondary | ICD-10-CM | POA: Diagnosis not present

## 2020-03-07 DIAGNOSIS — U071 COVID-19: Secondary | ICD-10-CM

## 2020-03-07 DIAGNOSIS — J9601 Acute respiratory failure with hypoxia: Secondary | ICD-10-CM | POA: Diagnosis not present

## 2020-03-07 DIAGNOSIS — R7989 Other specified abnormal findings of blood chemistry: Secondary | ICD-10-CM

## 2020-03-07 LAB — COMPREHENSIVE METABOLIC PANEL
ALT: 32 U/L (ref 0–44)
AST: 70 U/L — ABNORMAL HIGH (ref 15–41)
Albumin: 2.4 g/dL — ABNORMAL LOW (ref 3.5–5.0)
Alkaline Phosphatase: 84 U/L (ref 38–126)
Anion gap: 17 — ABNORMAL HIGH (ref 5–15)
BUN: 26 mg/dL — ABNORMAL HIGH (ref 8–23)
CO2: 14 mmol/L — ABNORMAL LOW (ref 22–32)
Calcium: 8.2 mg/dL — ABNORMAL LOW (ref 8.9–10.3)
Chloride: 109 mmol/L (ref 98–111)
Creatinine, Ser: 1.54 mg/dL — ABNORMAL HIGH (ref 0.61–1.24)
GFR, Estimated: 42 mL/min — ABNORMAL LOW (ref >60)
Glucose, Bld: 189 mg/dL — ABNORMAL HIGH (ref 70–99)
Potassium: 4.3 mmol/L (ref 3.5–5.1)
Sodium: 140 mmol/L (ref 135–145)
Total Bilirubin: 1 mg/dL (ref 0.3–1.2)
Total Protein: 5.7 g/dL — ABNORMAL LOW (ref 6.5–8.1)

## 2020-03-07 LAB — BASIC METABOLIC PANEL
Anion gap: 15 (ref 5–15)
BUN: 31 mg/dL — ABNORMAL HIGH (ref 8–23)
CO2: 17 mmol/L — ABNORMAL LOW (ref 22–32)
Calcium: 8.2 mg/dL — ABNORMAL LOW (ref 8.9–10.3)
Chloride: 108 mmol/L (ref 98–111)
Creatinine, Ser: 1.55 mg/dL — ABNORMAL HIGH (ref 0.61–1.24)
GFR, Estimated: 42 mL/min — ABNORMAL LOW (ref >60)
Glucose, Bld: 218 mg/dL — ABNORMAL HIGH (ref 70–99)
Potassium: 4.8 mmol/L (ref 3.5–5.1)
Sodium: 140 mmol/L (ref 135–145)

## 2020-03-07 LAB — CBC WITH DIFFERENTIAL/PLATELET
Abs Immature Granulocytes: 0.08 10*3/uL — ABNORMAL HIGH (ref 0.00–0.07)
Basophils Absolute: 0 10*3/uL (ref 0.0–0.1)
Basophils Relative: 0 %
Eosinophils Absolute: 0 10*3/uL (ref 0.0–0.5)
Eosinophils Relative: 0 %
HCT: 38.1 % — ABNORMAL LOW (ref 39.0–52.0)
Hemoglobin: 13.2 g/dL (ref 13.0–17.0)
Immature Granulocytes: 1 %
Lymphocytes Relative: 3 %
Lymphs Abs: 0.4 10*3/uL — ABNORMAL LOW (ref 0.7–4.0)
MCH: 31.9 pg (ref 26.0–34.0)
MCHC: 34.6 g/dL (ref 30.0–36.0)
MCV: 92 fL (ref 80.0–100.0)
Monocytes Absolute: 0.5 10*3/uL (ref 0.1–1.0)
Monocytes Relative: 4 %
Neutro Abs: 12.5 10*3/uL — ABNORMAL HIGH (ref 1.7–7.7)
Neutrophils Relative %: 92 %
Platelets: 283 10*3/uL (ref 150–400)
RBC: 4.14 MIL/uL — ABNORMAL LOW (ref 4.22–5.81)
RDW: 14.5 % (ref 11.5–15.5)
WBC: 13.5 10*3/uL — ABNORMAL HIGH (ref 4.0–10.5)
nRBC: 0 % (ref 0.0–0.2)

## 2020-03-07 LAB — GLUCOSE, CAPILLARY
Glucose-Capillary: 151 mg/dL — ABNORMAL HIGH (ref 70–99)
Glucose-Capillary: 158 mg/dL — ABNORMAL HIGH (ref 70–99)
Glucose-Capillary: 176 mg/dL — ABNORMAL HIGH (ref 70–99)
Glucose-Capillary: 193 mg/dL — ABNORMAL HIGH (ref 70–99)
Glucose-Capillary: 209 mg/dL — ABNORMAL HIGH (ref 70–99)
Glucose-Capillary: 223 mg/dL — ABNORMAL HIGH (ref 70–99)

## 2020-03-07 LAB — MAGNESIUM: Magnesium: 1.2 mg/dL — ABNORMAL LOW (ref 1.7–2.4)

## 2020-03-07 LAB — FERRITIN: Ferritin: 217 ng/mL (ref 24–336)

## 2020-03-07 LAB — URINE CULTURE: Culture: NO GROWTH

## 2020-03-07 LAB — BETA-HYDROXYBUTYRIC ACID: Beta-Hydroxybutyric Acid: 1.58 mmol/L — ABNORMAL HIGH (ref 0.05–0.27)

## 2020-03-07 LAB — PHOSPHORUS: Phosphorus: 3.5 mg/dL (ref 2.5–4.6)

## 2020-03-07 LAB — C-REACTIVE PROTEIN: CRP: 16 mg/dL — ABNORMAL HIGH (ref <1.0)

## 2020-03-07 LAB — D-DIMER, QUANTITATIVE: D-Dimer, Quant: 9.77 ug/mL-FEU — ABNORMAL HIGH (ref 0.00–0.50)

## 2020-03-07 LAB — PROCALCITONIN: Procalcitonin: 5.74 ng/mL

## 2020-03-07 MED ORDER — MELATONIN 3 MG PO TABS
3.0000 mg | ORAL_TABLET | Freq: Every evening | ORAL | Status: DC | PRN
  Administered 2020-03-12: 3 mg via ORAL
  Filled 2020-03-07 (×3): qty 1

## 2020-03-07 MED ORDER — MAGNESIUM SULFATE 4 GM/100ML IV SOLN
4.0000 g | Freq: Once | INTRAVENOUS | Status: AC
  Administered 2020-03-07: 4 g via INTRAVENOUS
  Filled 2020-03-07: qty 100

## 2020-03-07 MED ORDER — LABETALOL HCL 5 MG/ML IV SOLN
5.0000 mg | INTRAVENOUS | Status: DC | PRN
  Administered 2020-03-07: 5 mg via INTRAVENOUS
  Filled 2020-03-07: qty 4

## 2020-03-07 NOTE — Evaluation (Signed)
Physical Therapy Evaluation Patient Details Name: Joe [name] MRN: [id number] DOB: 14-Apr-1926 Today's Date: 03/07/2020   History of Present Illness  87 y.o. male with PMHx of HTN, DM-2, solitary kidney-presenting with confusion/nausea and vomiting-found to have acute metabolic encephalopathy, acute hypoxic respiratory failure, DKA due to COVID-19 infection.    Clinical Impression  Pt admitted with above diagnosis. PTA pt lived at home with his daughter. He ambulated with RW but unsure what level of assist he needed. Eval significantly limited by confusion and agitation. Unable to safely transition OOB. Pt requiring mod/max assist for bed mobility, primarily due to combativeness.  Pt currently with functional limitations due to the deficits listed below (see PT Problem List). Pt will benefit from skilled PT to increase their independence and safety with mobility to allow discharge to the venue listed below.  Anticipate need for SNF. If confusion, improves and pt able to safely participate in therapy/mobility, he may be able to return home depending on family's ability to assist.      Follow Up Recommendations SNF    Equipment Recommendations  Other (comment) (TBD)    Recommendations for Other Services       Precautions / Restrictions Precautions Precautions: Fall;Other (comment) Precaution Comments: restless, combative      Mobility  Bed Mobility Overal bed mobility: Needs Assistance Bed Mobility: Rolling;Supine to Sit;Sit to Supine Rolling: Mod assist   Supine to sit: Max assist Sit to supine: Max assist   General bed mobility comments: high level of assist needed due to agitated, combative, and resisting safe method/sequencing. Had to stop attempt to EOB and return to supine and bilat wrist restraints.    Transfers                 General transfer comment: unable due to safety  Ambulation/Gait                Stairs            Wheelchair  Mobility    Modified Rankin (Stroke Patients Only)       Balance                                             Pertinent Vitals/Pain Pain Assessment: Faces Faces Pain Scale: No hurt    Home Living Family/patient expects to be discharged to:: Private residence Living Arrangements: Children (daughter)                    Prior Function Level of Independence: Needs assistance   Gait / Transfers Assistance Needed: ambulates with RW     Comments: above information taken from chart. Pt is unable to provide history.     Hand Dominance        Extremity/Trunk Assessment   Upper Extremity Assessment Upper Extremity Assessment: Difficult to assess due to impaired cognition    Lower Extremity Assessment Lower Extremity Assessment: Difficult to assess due to impaired cognition       Communication   Communication: Other (comment) (confused, nonsensical speech, rambling)  Cognition Arousal/Alertness: Awake/alert Behavior During Therapy: Restless;Agitated Overall Cognitive Status: No family/caregiver present to determine baseline cognitive functioning                                 General Comments: Confused. Combative. Not following commands.  Difficult to redirect.      General Comments General comments (skin integrity, edema, etc.): Pt in bed with bilat wrist restraints. Restraints removed slowly, allowing time to assess after removal of first restraint before removing second. Pt appeared calm and cooperative after first restraint removed. After removal of second restraint, pt immediately restless, grabbing at lines, and flinging legs over EOB. Unable to redirect. Required return to supine with bilat wrist restraints reapplied. VSS on 2L.    Exercises     Assessment/Plan    PT Assessment Patient needs continued PT services  PT Problem List Decreased mobility;Decreased safety awareness;Decreased knowledge of precautions;Decreased  cognition       PT Treatment Interventions Therapeutic activities;DME instruction;Cognitive remediation;Gait training;Therapeutic exercise;Patient/family education;Balance training;Functional mobility training    PT Goals (Current goals can be found in the Care Plan section)  Acute Rehab PT Goals Patient Stated Goal: unable to state PT Goal Formulation: Patient unable to participate in goal setting Time For Goal Achievement: 03/21/20 Potential to Achieve Goals: Fair    Frequency Min 2X/week   Barriers to discharge        Co-evaluation               AM-PAC PT "6 Clicks" Mobility  Outcome Measure Help needed turning from your back to your side while in a flat bed without using bedrails?: A Lot Help needed moving from lying on your back to sitting on the side of a flat bed without using bedrails?: A Lot Help needed moving to and from a bed to a chair (including a wheelchair)?: Total Help needed standing up from a chair using your arms (e.g., wheelchair or bedside chair)?: Total Help needed to walk in hospital room?: Total Help needed climbing 3-5 steps with a railing? : Total 6 Click Score: 8    End of Session Equipment Utilized During Treatment: Oxygen Activity Tolerance: Treatment limited secondary to agitation Patient left: in bed;with call bell/phone within reach;with bed alarm set Nurse Communication: Mobility status PT Visit Diagnosis: Other abnormalities of gait and mobility (R26.89)    Time: 5625-6389 PT Time Calculation (min) (ACUTE ONLY): 14 min   Charges:   PT Evaluation $PT Eval Moderate Complexity: 1 Mod          Lorrin Goodell, PT  Office # [phone] Pager [phone]   Lorriane Shire 03/07/2020, 3:36 PM

## 2020-03-07 NOTE — Plan of Care (Signed)
  Problem: Education: Goal: Knowledge of General Education information will improve Description Including pain rating scale, medication(s)/side effects and non-pharmacologic comfort measures Outcome: Progressing   

## 2020-03-07 NOTE — Progress Notes (Signed)
VASCULAR LAB    Bilateral lower extremity venous duplex has been performed.  See CV proc for preliminary results.   Donnovan Stamour, RVT 03/07/2020, 6:26 PM

## 2020-03-07 NOTE — Progress Notes (Signed)
PROGRESS NOTE                                                                                                                                                                                                             Patient Demographics:    Joe [name], is a 87 y.o. male, DOB - 1926-08-22, YS:[id number]  Outpatient Primary MD for the patient is Patient, No Pcp Per   Admit date - 03/06/2020   LOS - 1  Chief Complaint  Patient presents with  . Emesis       Brief Narrative: Patient is a 87 y.o. male with PMHx of HTN, DM-2, solitary kidney-presenting with confusion/nausea and vomiting-found to have acute metabolic encephalopathy, acute hypoxic respiratory failure, DKA due to COVID-19 infection. See below for further details.   COVID-19 vaccinated status:   Significant Events: 2/5>> Admit to [facility] for sepsis/hypoxia (on 6 L of HFNC)-due to COVID-19, AKI and DKA  Significant studies: 2/4>> chest x-ray: Left base infiltrate 2/5>>Chest x-ray: Bilateral multifocal pneumonia  COVID-19 medications: Steroids: 2/4>> Remdesivir: 2/4>> Actemra: X1 on 2/4  Antibiotics: Zithromax: 2/4>> Rocephin: 2/4>> Cefepime: 2/4 x 1 Flagyl: 2/4 x 1 Vancomycin: 2/4 x 1  Microbiology data: 2/4 >>blood culture: No growth  Procedures: None  Consults: None  DVT prophylaxis: enoxaparin (LOVENOX) injection 40 mg Start: 03/06/20 0900    Subjective:    Joe [name] today remains pleasantly confused today-oxygenation has improved-down to 3 L of oxygen.   Assessment  & Plan :   Acute Hypoxic Resp Failure due to Covid 19 Viral pneumonia +/-bacterial pneumonia: Hypoxia has improved-down to 2-3 L of oxygen this morning. CRP remains significantly elevated-continue steroids/Remdesivir and empiric antibiotics.  Fever: T-max 102.7 overnight O2 requirements:  SpO2: 92 % O2 Flow Rate (L/min): 3 L/min   COVID-19 Labs: Recent Labs     03/06/20 0820 03/07/20 0205  DDIMER 13.49* 9.77*  FERRITIN 176 217  LDH 236*  --   CRP 11.2* 16.0*       Component Value Date/Time   BNP 87.3 03/06/2020 0820    Recent Labs  Lab 03/06/20 0820 03/07/20 0653  PROCALCITON 4.05 5.74    Lab Results  Component Value Date   SARSCOV2NAA POSITIVE (A) 03/06/2020     Prone/Incentive Spirometry: Encouraged incentive spirometry use-but confused-will not follow commands.  Elevated D-dimer: Likely due to  COVID-19 related inflammation-hypoxia has improved therefore doubt PE- check a lower extremity Doppler first.  Sepsis due to COVID-19 pneumonia +/-bacterial pneumonia: Sepsis physiology improving-continue steroid/Remdesivir/Rocephin/Zithromax. Await cultures. Trend procalcitonin.  Acute metabolic encephalopathy: Suspect due to sepsis/hypoxemia/DKA-very elderly-may have some underlying cognitive dysfunction as well. Nonfocal exam-still very confused-but apparently is very hard of hearing which may be playing a role as well  DKA: Overall improved-transitioned off insulin infusion-on SQ insulin. Continues to have some mild metabolic acidosis which could be from AKI/potential chronic RTA.  Acute kidney injury: Likely hemodynamically mediated-No recent labs to compare with-labs in 2008 were normal. Suspect given history of congenital solitary kidney-May have developed some amount of CKD at baseline.   Hypomagnesemia: Probably due to insulin therapy-replete and recheck in a.m.  HTN: BP on the higher side-continue metoprolol-add amlodipine  Insulin-dependent DM-2 (A1c 8.7 on 2/4): No longer in DKA-CBGs stable-continue Levemir 8 units twice daily, SSI. Follow and adjust.   Recent Labs    03/07/20 0019 03/07/20 0623 03/07/20 0749  GLUCAP 158* 193* 209*   Deconditioning/debility: Due to acute illness-await PT/OT eval  Hard of hearing  Obesity: Estimated body mass index is 30.85 kg/m as calculated from the following:   Height as  of this encounter: 5\' 10"  (1.778 m).   Weight as of this encounter: 97.5 kg.   RN pressure injury documentation:    GI prophylaxis: H2 Blocker  ABG:    Component Value Date/Time   HCO3 16.6 (L) 03/06/2020 0659   TCO2 18 (L) 03/06/2020 0659   ACIDBASEDEF 9.0 (H) 03/06/2020 0659   O2SAT 100.0 03/06/2020 0659    Vent Settings: N/A    Condition - Extremely Guarded  Family Communication  :  Daughter-[phone] updated over the phone 2/5  Code Status :  DNR  Diet :  Diet Order            Diet Carb Modified Fluid consistency: Thin; Room service appropriate? Yes with Assist  Diet effective now                  Disposition Plan  :   Status is: Inpatient Remains inpatient appropriate because:Inpatient level of care appropriate due to severity of illness   Dispo: The patient is from: Home              Anticipated d/c is to: TBD              Anticipated d/c date is: > 3 days              Patient currently is not medically stable to d/c.   Difficult to place patient No   Barriers to discharge: Hypoxia requiring O2 supplementation/complete 5 days of IV Remdesivir  Antimicorbials  :    Anti-infectives (From admission, onward)   Start     Dose/Rate Route Frequency Ordered Stop   03/07/20 1000  remdesivir 100 mg in sodium chloride 0.9 % 100 mL IVPB       "Followed by" Linked Group Details   100 mg 200 mL/hr over 30 Minutes Intravenous Daily 03/06/20 0555 03/11/20 0959   03/06/20 1600  cefTRIAXone (ROCEPHIN) 1 g in sodium chloride 0.9 % 100 mL IVPB        1 g 200 mL/hr over 30 Minutes Intravenous Every 24 hours 03/06/20 0810     03/06/20 1400  azithromycin (ZITHROMAX) 500 mg in sodium chloride 0.9 % 250 mL IVPB  Status:  Discontinued        500 mg 250  mL/hr over 60 Minutes Intravenous Every 24 hours 03/06/20 0810 03/06/20 0837   03/06/20 0900  azithromycin (ZITHROMAX) 500 mg in sodium chloride 0.9 % 250 mL IVPB        500 mg 250 mL/hr over 60 Minutes Intravenous  Every 24 hours 03/06/20 0837     03/06/20 0700  remdesivir 200 mg in sodium chloride 0.9% 250 mL IVPB       "Followed by" Linked Group Details   200 mg 580 mL/hr over 30 Minutes Intravenous Once 03/06/20 0555 03/06/20 0835   03/06/20 0430  vancomycin (VANCOREADY) IVPB 2000 mg/400 mL        2,000 mg 200 mL/hr over 120 Minutes Intravenous  Once 03/06/20 0423 03/06/20 0713   03/06/20 0400  ceFEPIme (MAXIPIME) 2 g in sodium chloride 0.9 % 100 mL IVPB        2 g 200 mL/hr over 30 Minutes Intravenous  Once 03/06/20 0358 03/06/20 0452   03/06/20 0400  metroNIDAZOLE (FLAGYL) IVPB 500 mg        500 mg 100 mL/hr over 60 Minutes Intravenous  Once 03/06/20 0358 03/06/20 0622   03/06/20 0400  vancomycin (VANCOCIN) IVPB 1000 mg/200 mL premix  Status:  Discontinued        1,000 mg 200 mL/hr over 60 Minutes Intravenous  Once 03/06/20 0358 03/06/20 0423      Inpatient Medications  Scheduled Meds: . albuterol  2 puff Inhalation Q6H  . vitamin C  500 mg Oral Daily  . aspirin EC  81 mg Oral Daily  . enoxaparin (LOVENOX) injection  40 mg Subcutaneous Q24H  . insulin aspart  0-15 Units Subcutaneous TID WC  . insulin detemir  8 Units Subcutaneous BID  . methylPREDNISolone (SOLU-MEDROL) injection  50 mg Intravenous Q12H   Followed by  . [START ON 03/10/2020] predniSONE  50 mg Oral Daily  . metoprolol succinate  50 mg Oral Daily  . sodium chloride flush  3 mL Intravenous Q12H  . zinc sulfate  220 mg Oral Daily   Continuous Infusions: . azithromycin Stopped (03/06/20 1155)  . cefTRIAXone (ROCEPHIN)  IV 1 g (03/06/20 1826)  . famotidine (PEPCID) IV 20 mg (03/06/20 2003)  . magnesium sulfate bolus IVPB 4 g (03/07/20 1046)  . remdesivir 100 mg in NS 100 mL     PRN Meds:.acetaminophen, acetaminophen, dextrose, guaiFENesin-dextromethorphan, melatonin, ondansetron **OR** ondansetron (ZOFRAN) IV   Time Spent in minutes  35    See all Orders from today for further details   Oren Binet M.D on  03/07/2020 at 10:53 AM  To page go to [email or web address] - use universal password  [facility] -  Office  [phone]    Objective:   Vitals:   03/06/20 2115 03/06/20 2157 03/07/20 0545 03/07/20 0753  BP: (!) 152/102  (!) 150/114 (!) 155/99  Pulse: (!) 123  (!) 106 (!) 110  Resp: 20  (!) 21 19  Temp: (!) 97.5 F (36.4 C) 98.1 F (36.7 C) 98.9 F (37.2 C) 99.5 F (37.5 C)  TempSrc: Oral Axillary Oral   SpO2: 95%  97% 92%  Weight:      Height:        Wt Readings from Last 3 Encounters:  03/06/20 97.5 kg    No intake or output data in the 24 hours ending 03/07/20 1053   Physical Exam Gen Exam:Alert awake-confused-but not in any distress. HEENT:atraumatic, normocephalic Chest: B/L clear to auscultation anteriorly CVS:S1S2 regular Abdomen:soft non tender, non distended Extremities:no edema Neurology:  Non focal Skin: no rash   Data Review:    CBC Recent Labs  Lab 03/06/20 0358 03/06/20 0659 03/07/20 0205  WBC 13.9*  --  13.5*  HGB 14.3 12.9* 13.2  HCT 43.8 38.0* 38.1*  PLT 275  --  283  MCV 94.0  --  92.0  MCH 30.7  --  31.9  MCHC 32.6  --  34.6  RDW 14.1  --  14.5  LYMPHSABS 0.5*  --  0.4*  MONOABS 0.8  --  0.5  EOSABS 0.0  --  0.0  BASOSABS 0.0  --  0.0    Chemistries  Recent Labs  Lab 03/06/20 0358 03/06/20 0659 03/06/20 0700 03/06/20 1215 03/06/20 1614 03/07/20 0205 03/07/20 0653  NA 135   < > 136 140 140 140 140  K 4.9   < > 5.0 4.3 4.7 4.3 4.8  CL 98  --  105 109 110 109 108  CO2 16*  --  17* 19* 18* 14* 17*  GLUCOSE 450*  --  383* 165* 122* 189* 218*  BUN 20  --  19 18 20  26* 31*  CREATININE 1.59*  --  1.52* 1.39* 1.32* 1.54* 1.55*  CALCIUM 9.1  --  7.6* 8.3* 8.2* 8.2* 8.2*  MG  --   --   --   --   --  1.2*  --   AST 46*  --   --   --   --  70*  --   ALT 33  --   --   --   --  32  --   ALKPHOS 114  --   --   --   --  84  --   BILITOT 0.7  --   --   --   --  1.0  --    < > = values in this interval not displayed.    ------------------------------------------------------------------------------------------------------------------ No results for input(s): CHOL, HDL, LDLCALC, TRIG, CHOLHDL, LDLDIRECT in the last 72 hours.  Lab Results  Component Value Date   HGBA1C 8.7 (H) 03/06/2020   ------------------------------------------------------------------------------------------------------------------ No results for input(s): TSH, T4TOTAL, T3FREE, THYROIDAB in the last 72 hours.  Invalid input(s): FREET3 ------------------------------------------------------------------------------------------------------------------ Recent Labs    03/06/20 0820 03/07/20 0205  FERRITIN 176 217    Coagulation profile Recent Labs  Lab 03/06/20 0358  INR 1.1    Recent Labs    03/06/20 0820 03/07/20 0205  DDIMER 13.49* 9.77*    Cardiac Enzymes No results for input(s): CKMB, TROPONINI, MYOGLOBIN in the last 168 hours.  Invalid input(s): CK ------------------------------------------------------------------------------------------------------------------    Component Value Date/Time   BNP 87.3 03/06/2020 0820    Micro Results Recent Results (from the past 240 hour(s))  SARS Coronavirus 2 by RT PCR (hospital order, performed in [facility] hospital lab) Nasopharyngeal Nasopharyngeal Swab     Status: Abnormal   Collection Time: 03/06/20  3:58 AM   Specimen: Nasopharyngeal Swab  Result Value Ref Range Status   SARS Coronavirus 2 POSITIVE (A) NEGATIVE Final    Comment: RESULT CALLED TO, READ BACK BY AND VERIFIED WITH: K GIBSON RN 03/06/20 0538 JDW (NOTE) SARS-CoV-2 target nucleic acids are DETECTED  SARS-CoV-2 RNA is generally detectable in upper respiratory specimens  during the acute phase of infection.  Positive results are indicative  of the presence of the identified virus, but do not rule out bacterial infection or co-infection with other pathogens not detected by the test.  Clinical correlation  with patient history and  other diagnostic information is necessary to determine patient infection status.  The expected result is negative.  Fact Sheet for Patients:   StrictlyIdeas.no   Fact Sheet for Healthcare Providers:   BankingDealers.co.za    This test is not yet approved or cleared by the [place] FDA and  has been authorized for detection and/or diagnosis of SARS-CoV-2 by FDA under an Emergency Use Authorization (EUA).  This EUA will remain in effect (meaning this test can  be used) for the duration of  the COVID-19 declaration under Section 564(b)(1) of the Act, 21 U.S.C. section 360-bbb-3(b)(1), unless the authorization is terminated or revoked sooner.  Performed at [facility], [place] [address] [place], [place] [address]   Blood Culture (routine x 2)     Status: None (Preliminary result)   Collection Time: 03/06/20  3:58 AM   Specimen: BLOOD RIGHT WRIST  Result Value Ref Range Status   Specimen Description BLOOD RIGHT WRIST  Final   Special Requests   Final    BOTTLES DRAWN AEROBIC AND ANAEROBIC Blood Culture results may not be optimal due to an inadequate volume of blood received in culture bottles   Culture   Final    NO GROWTH 1 DAY Performed at [facility], [place] [address] [place], [place] [address]    Report Status PENDING  Incomplete  Urine culture     Status: None   Collection Time: 03/06/20  3:58 AM   Specimen: In/Out Cath Urine  Result Value Ref Range Status   Specimen Description IN/OUT CATH URINE  Final   Special Requests NONE  Final   Culture   Final    NO GROWTH Performed at [facility], [place] [address], [place], [place] [address]    Report Status 03/07/2020 FINAL  Final  Blood Culture (routine x 2)     Status: None (Preliminary result)   Collection Time: 03/06/20  4:20 AM   Specimen: BLOOD RIGHT HAND  Result Value Ref Range Status   Specimen Description BLOOD RIGHT HAND   Final   Special Requests   Final    BOTTLES DRAWN AEROBIC AND ANAEROBIC Blood Culture results may not be optimal due to an inadequate volume of blood received in culture bottles   Culture   Final    NO GROWTH 1 DAY Performed at [facility], [place] [address] [place], [place] [address]    Report Status PENDING  Incomplete    Radiology Reports DG Chest Port 1 View  Result Date: 03/06/2020 CLINICAL DATA:  Questionable sepsis EXAM: PORTABLE CHEST 1 VIEW COMPARISON:  10/30/2006 FINDINGS: Infiltrate at the left lung base. Accentuation of markings at the right base which may be atelectasis in the setting of low lung volumes. Cardiomegaly which is stable. No pulmonary edema, effusion, or pneumothorax. IMPRESSION: Low volume chest with infiltrate at the left base. Electronically Signed   By: Monte Fantasia M.D.   On: 03/06/2020 04:13   DG Chest Port 1V same Day  Result Date: 03/07/2020 CLINICAL DATA:  Weakness. EXAM: PORTABLE CHEST 1 VIEW COMPARISON:  March 06, 2020. FINDINGS: Stable cardiomediastinal silhouette. Patchy airspace opacities are noted bilaterally consistent with multifocal pneumonia. No pneumothorax pleural effusion is noted. Bony thorax is unremarkable. IMPRESSION: Bilateral multifocal pneumonia. Electronically Signed   By: Marijo Conception M.D.   On: 03/07/2020 09:24

## 2020-03-08 ENCOUNTER — Inpatient Hospital Stay: Payer: Self-pay

## 2020-03-08 DIAGNOSIS — E1111 Type 2 diabetes mellitus with ketoacidosis with coma: Secondary | ICD-10-CM | POA: Diagnosis not present

## 2020-03-08 DIAGNOSIS — J9601 Acute respiratory failure with hypoxia: Secondary | ICD-10-CM | POA: Diagnosis not present

## 2020-03-08 DIAGNOSIS — U071 COVID-19: Secondary | ICD-10-CM | POA: Diagnosis not present

## 2020-03-08 DIAGNOSIS — N179 Acute kidney failure, unspecified: Secondary | ICD-10-CM | POA: Diagnosis not present

## 2020-03-08 LAB — CBC WITH DIFFERENTIAL/PLATELET
Abs Immature Granulocytes: 0.05 10*3/uL (ref 0.00–0.07)
Basophils Absolute: 0 10*3/uL (ref 0.0–0.1)
Basophils Relative: 0 %
Eosinophils Absolute: 0 10*3/uL (ref 0.0–0.5)
Eosinophils Relative: 0 %
HCT: 41.4 % (ref 39.0–52.0)
Hemoglobin: 13.9 g/dL (ref 13.0–17.0)
Immature Granulocytes: 1 %
Lymphocytes Relative: 3 %
Lymphs Abs: 0.3 10*3/uL — ABNORMAL LOW (ref 0.7–4.0)
MCH: 31 pg (ref 26.0–34.0)
MCHC: 33.6 g/dL (ref 30.0–36.0)
MCV: 92.2 fL (ref 80.0–100.0)
Monocytes Absolute: 0.3 10*3/uL (ref 0.1–1.0)
Monocytes Relative: 3 %
Neutro Abs: 8.3 10*3/uL — ABNORMAL HIGH (ref 1.7–7.7)
Neutrophils Relative %: 93 %
Platelets: 284 10*3/uL (ref 150–400)
RBC: 4.49 MIL/uL (ref 4.22–5.81)
RDW: 14.6 % (ref 11.5–15.5)
WBC: 8.9 10*3/uL (ref 4.0–10.5)
nRBC: 0 % (ref 0.0–0.2)

## 2020-03-08 LAB — COMPREHENSIVE METABOLIC PANEL
ALT: 50 U/L — ABNORMAL HIGH (ref 0–44)
AST: 138 U/L — ABNORMAL HIGH (ref 15–41)
Albumin: 2.5 g/dL — ABNORMAL LOW (ref 3.5–5.0)
Alkaline Phosphatase: 74 U/L (ref 38–126)
Anion gap: 16 — ABNORMAL HIGH (ref 5–15)
BUN: 36 mg/dL — ABNORMAL HIGH (ref 8–23)
CO2: 16 mmol/L — ABNORMAL LOW (ref 22–32)
Calcium: 8.1 mg/dL — ABNORMAL LOW (ref 8.9–10.3)
Chloride: 107 mmol/L (ref 98–111)
Creatinine, Ser: 1.36 mg/dL — ABNORMAL HIGH (ref 0.61–1.24)
GFR, Estimated: 49 mL/min — ABNORMAL LOW (ref >60)
Glucose, Bld: 215 mg/dL — ABNORMAL HIGH (ref 70–99)
Potassium: 4.4 mmol/L (ref 3.5–5.1)
Sodium: 139 mmol/L (ref 135–145)
Total Bilirubin: 0.8 mg/dL (ref 0.3–1.2)
Total Protein: 5.6 g/dL — ABNORMAL LOW (ref 6.5–8.1)

## 2020-03-08 LAB — GLUCOSE, CAPILLARY
Glucose-Capillary: 200 mg/dL — ABNORMAL HIGH (ref 70–99)
Glucose-Capillary: 206 mg/dL — ABNORMAL HIGH (ref 70–99)
Glucose-Capillary: 215 mg/dL — ABNORMAL HIGH (ref 70–99)
Glucose-Capillary: 164 mg/dL — ABNORMAL HIGH (ref 70–99)

## 2020-03-08 LAB — PROCALCITONIN: Procalcitonin: 3.75 ng/mL

## 2020-03-08 LAB — C-REACTIVE PROTEIN: CRP: 10 mg/dL — ABNORMAL HIGH (ref <1.0)

## 2020-03-08 LAB — MAGNESIUM: Magnesium: 1.9 mg/dL (ref 1.7–2.4)

## 2020-03-08 LAB — D-DIMER, QUANTITATIVE: D-Dimer, Quant: 3.34 ug{FEU}/mL — ABNORMAL HIGH (ref 0.00–0.50)

## 2020-03-08 MED ORDER — SODIUM CHLORIDE 0.45 % IV SOLN
INTRAVENOUS | Status: AC
  Filled 2020-03-08 (×2): qty 1000

## 2020-03-08 MED ORDER — SODIUM BICARBONATE 650 MG PO TABS
650.0000 mg | ORAL_TABLET | Freq: Two times a day (BID) | ORAL | Status: DC
  Administered 2020-03-08: 650 mg via ORAL
  Filled 2020-03-08: qty 1

## 2020-03-08 MED ORDER — QUETIAPINE FUMARATE 25 MG PO TABS: 25.0000 mg | ORAL_TABLET | Freq: Every day | ORAL | Status: DC

## 2020-03-08 MED ORDER — QUETIAPINE FUMARATE 25 MG PO TABS
25.0000 mg | ORAL_TABLET | Freq: Every day | ORAL | Status: DC
  Administered 2020-03-08 – 2020-03-10 (×3): 25 mg via ORAL
  Filled 2020-03-08 (×3): qty 1

## 2020-03-08 MED ORDER — AMLODIPINE BESYLATE 5 MG PO TABS
5.0000 mg | ORAL_TABLET | Freq: Every day | ORAL | Status: DC
  Administered 2020-03-08: 5 mg via ORAL
  Filled 2020-03-08: qty 1

## 2020-03-08 MED ORDER — MAGNESIUM SULFATE 2 GM/50ML IV SOLN
2.0000 g | Freq: Once | INTRAVENOUS | Status: AC
  Administered 2020-03-08: 2 g via INTRAVENOUS
  Filled 2020-03-08: qty 50

## 2020-03-08 MED ORDER — METOPROLOL SUCCINATE ER 50 MG PO TB24
75.0000 mg | ORAL_TABLET | Freq: Every day | ORAL | Status: DC
  Administered 2020-03-08 – 2020-03-17 (×10): 75 mg via ORAL
  Filled 2020-03-08 (×10): qty 1

## 2020-03-08 MED ORDER — METHYLPREDNISOLONE SODIUM SUCC 40 MG IJ SOLR
40.0000 mg | Freq: Two times a day (BID) | INTRAMUSCULAR | Status: DC
  Administered 2020-03-08 (×2): 40 mg via INTRAVENOUS
  Filled 2020-03-08 (×2): qty 1

## 2020-03-08 MED ORDER — QUETIAPINE FUMARATE 25 MG PO TABS
25.0000 mg | ORAL_TABLET | Freq: Two times a day (BID) | ORAL | Status: DC
  Administered 2020-03-08: 25 mg via ORAL
  Filled 2020-03-08: qty 1

## 2020-03-08 MED ORDER — HYDRALAZINE HCL 20 MG/ML IJ SOLN
10.0000 mg | Freq: Four times a day (QID) | INTRAMUSCULAR | Status: DC | PRN
  Administered 2020-03-15: 10 mg via INTRAVENOUS
  Filled 2020-03-08: qty 1

## 2020-03-08 MED ORDER — PREDNISONE 5 MG PO TABS: 50.0000 mg | ORAL_TABLET | Freq: Every day | ORAL | Status: DC

## 2020-03-08 MED ORDER — SODIUM CHLORIDE 0.9% FLUSH
10.0000 mL | INTRAVENOUS | Status: DC | PRN
Start: 2020-03-08 — End: 2020-03-16

## 2020-03-08 MED ORDER — CHLORHEXIDINE GLUCONATE CLOTH 2 % EX PADS
6.0000 | MEDICATED_PAD | Freq: Every day | CUTANEOUS | Status: DC
  Administered 2020-03-08 – 2020-03-17 (×9): 6 via TOPICAL

## 2020-03-08 NOTE — Progress Notes (Signed)
Peripherally Inserted Central Catheter Placement  The IV Nurse has discussed with the patient and/or persons authorized to consent for the patient, the purpose of this procedure and the potential benefits and risks involved with this procedure.  The benefits include less needle sticks, lab draws from the catheter, and the patient may be discharged home with the catheter. Risks include, but not limited to, infection, bleeding, blood clot (thrombus formation), and puncture of an artery; nerve damage and irregular heartbeat and possibility to perform a PICC exchange if needed/ordered by physician.  Alternatives to this procedure were also discussed.  Bard Power PICC patient education guide, fact sheet on infection prevention and patient information card has been provided to patient /or left at bedside.    PICC Placement Documentation  PICC Double Lumen 03/08/20 PICC Right Brachial 37 cm 0 cm (Active)  Indication for Insertion or Continuance of Line Poor Vasculature-patient has had multiple peripheral attempts or PIVs lasting less than 24 hours 03/08/20 1754  Exposed Catheter (cm) 0 cm 03/08/20 1754  Site Assessment Clean;Dry;Intact 03/08/20 1754  Lumen #1 Status Flushed;Blood return noted;Saline locked 03/08/20 1754  Lumen #2 Status Flushed;Blood return noted;Saline locked 03/08/20 1754  Dressing Type Transparent 03/08/20 1754  Dressing Status Clean;Dry;Intact 03/08/20 1754  Antimicrobial disc in place? Yes 03/08/20 1754  Dressing Change Due 03/15/20 03/08/20 1754       Scotty Court 03/08/2020, 5:56 PM

## 2020-03-08 NOTE — Progress Notes (Signed)
PROGRESS NOTE                                                                                                                                                                                                             Patient Demographics:    Joe [name], is a 87 y.o. male, DOB - 1926-04-16, [id number]  Outpatient Primary MD for the patient is Patient, No Pcp Per   Admit date - 03/06/2020   LOS - 2  Chief Complaint  Patient presents with  . Emesis       Brief Narrative: Patient is a 87 y.o. male with PMHx of HTN, DM-2, solitary kidney-presenting with confusion/nausea and vomiting-found to have acute metabolic encephalopathy, acute hypoxic respiratory failure, DKA due to COVID-19 infection. See below for further details.   COVID-19 vaccinated status:   Significant Events: 2/5>> Admit to [facility] for sepsis/hypoxia (on 6 L of HFNC)-due to COVID-19, AKI and DKA  Significant studies: 2/4>> chest x-ray: Left base infiltrate 2/5>>Chest x-ray: Bilateral multifocal pneumonia 2/5>> bilateral lower extremity Doppler: No DVT.  COVID-19 medications: Steroids: 2/4>> Remdesivir: 2/4>> Actemra: X1 on 2/4  Antibiotics: Zithromax: 2/4>> Rocephin: 2/4>> Cefepime: 2/4 x 1 Flagyl: 2/4 x 1 Vancomycin: 2/4 x 1  Microbiology data: 2/4 >>blood culture: No growth 2/4>> urine culture: No growth  Procedures: NoneRemains confused  Consults: None  DVT prophylaxis: enoxaparin (LOVENOX) injection 40 mg Start: 03/06/20 0900    Subjective:   Remains confused-on just 2-3 L of oxygen this morning.  Not in any distress.   Assessment  & Plan :   Acute Hypoxic Resp Failure due to Covid 19 Viral pneumonia +/-bacterial pneumonia: Hypoxia stable-gradually improving-minimal oxygen requirement.  CRP downtrending.  Remains on steroid/Remdesivir and empiric antibiotics.  Fever: Afebrile. O2 requirements:  SpO2: 91 % O2 Flow Rate  (L/min): 3 L/min   COVID-19 Labs: Recent Labs    03/06/20 0820 03/07/20 0205 03/08/20 0203  DDIMER 13.49* 9.77* 3.34*  FERRITIN 176 217  --   LDH 236*  --   --   CRP 11.2* 16.0* 10.0*       Component Value Date/Time   BNP 87.3 03/06/2020 0820    Recent Labs  Lab 03/06/20 0820 03/07/20 0653 03/08/20 0203  PROCALCITON 4.05 5.74 3.75    Lab Results  Component Value Date   SARSCOV2NAA POSITIVE (A) 03/06/2020  Prone/Incentive Spirometry: Encouraged incentive spirometry use-but confused-will not follow commands.  Elevated D-dimer: Likely due to COVID-19 related inflammation-lower extremity Dopplers negative-with improving hypoxemia-doubt further work-up is required.  On prophylactic Lovenox.  Sepsis due to COVID-19 pneumonia +/-bacterial pneumonia: Sepsis physiology continues to improve-blood cultures negative so far-procalcitonin downtrending-on empiric Rocephin/Zithromax along with steroid/Remdesivir.  Follow closely.    Acute metabolic encephalopathy: Suspect due to sepsis/hypoxemia/DKA/steroid use-very elderly-has significant hearing impairment-probably may have some underlying cognitive dysfunction.  He is still very confused-and at times requiring restraints.  Start low-dose Seroquel-suspect that some of the delirium may also be because he is in a very noisy room with a HEPA filter-have spoken with nursing staff-we will see if we can get him to a quieter room up in 5 W. that may help with his delirium.  Bedside sitter ordered.  Maintain delirium precautions.  DKA: Overall improved-transitioned off insulin infusion-on SQ insulin. Continues to have some mild metabolic acidosis which could be from AKI/potential chronic RTA.  Acute kidney injury: Likely hemodynamically mediated-No recent labs to compare with-labs in 2008 were normal. Suspect given history of congenital solitary kidney-May have developed some amount of CKD at baseline.   Hypomagnesemia: Improved-continue to  replete and recheck.  HTN: BP slowly improving-metoprolol increased to 75 mg p.o. daily, added low-dose amlodipine.  Follow and adjust.   Insulin-dependent DM-2 (A1c 8.7 on 2/4): CBGs relatively stable-allow some permissive hyperglycemia-not a candidate for strict glycemic control-continue Levemir 8 units twice daily and SSI.  Follow and adjust.     Recent Labs    03/07/20 2144 03/08/20 0801 03/08/20 1150  GLUCAP 151* 200* 206*   Deconditioning/debility: Due to acute illness-evaluated by rehab services-recommendations of SNF.  Hard of hearing  Obesity: Estimated body mass index is 30.85 kg/m as calculated from the following:   Height as of this encounter: 5\' 10"  (1.778 m).   Weight as of this encounter: 97.5 kg.     GI prophylaxis: H2 Blocker  ABG:    Component Value Date/Time   HCO3 16.6 (L) 03/06/2020 0659   TCO2 18 (L) 03/06/2020 0659   ACIDBASEDEF 9.0 (H) 03/06/2020 0659   O2SAT 100.0 03/06/2020 0659    Vent Settings: N/A    Condition - Extremely Guarded  Family Communication  :  Daughter-[phone] left voicemial on 2/6  Code Status :  DNR  Diet :  Diet Order            Diet Carb Modified Fluid consistency: Thin; Room service appropriate? Yes with Assist  Diet effective now                  Disposition Plan  :   Status is: Inpatient Remains inpatient appropriate because:Inpatient level of care appropriate due to severity of illness   Dispo: The patient is from: Home              Anticipated d/c is to: TBD              Anticipated d/c date is: > 3 days              Patient currently is not medically stable to d/c.   Difficult to place patient No   Barriers to discharge: Hypoxia requiring O2 supplementation/complete 5 days of IV Remdesivir  Antimicorbials  :    Anti-infectives (From admission, onward)   Start     Dose/Rate Route Frequency Ordered Stop   03/07/20 1000  remdesivir 100 mg in sodium chloride 0.9 % 100 mL IVPB        "  Followed by" Linked Group Details   100 mg 200 mL/hr over 30 Minutes Intravenous Daily 03/06/20 0555 03/11/20 0959   03/06/20 1600  cefTRIAXone (ROCEPHIN) 1 g in sodium chloride 0.9 % 100 mL IVPB        1 g 200 mL/hr over 30 Minutes Intravenous Every 24 hours 03/06/20 0810     03/06/20 1400  azithromycin (ZITHROMAX) 500 mg in sodium chloride 0.9 % 250 mL IVPB  Status:  Discontinued        500 mg 250 mL/hr over 60 Minutes Intravenous Every 24 hours 03/06/20 0810 03/06/20 0837   03/06/20 0900  azithromycin (ZITHROMAX) 500 mg in sodium chloride 0.9 % 250 mL IVPB        500 mg 250 mL/hr over 60 Minutes Intravenous Every 24 hours 03/06/20 0837     03/06/20 0700  remdesivir 200 mg in sodium chloride 0.9% 250 mL IVPB       "Followed by" Linked Group Details   200 mg 580 mL/hr over 30 Minutes Intravenous Once 03/06/20 0555 03/06/20 0835   03/06/20 0430  vancomycin (VANCOREADY) IVPB 2000 mg/400 mL        2,000 mg 200 mL/hr over 120 Minutes Intravenous  Once 03/06/20 0423 03/06/20 0713   03/06/20 0400  ceFEPIme (MAXIPIME) 2 g in sodium chloride 0.9 % 100 mL IVPB        2 g 200 mL/hr over 30 Minutes Intravenous  Once 03/06/20 0358 03/06/20 0452   03/06/20 0400  metroNIDAZOLE (FLAGYL) IVPB 500 mg        500 mg 100 mL/hr over 60 Minutes Intravenous  Once 03/06/20 0358 03/06/20 0622   03/06/20 0400  vancomycin (VANCOCIN) IVPB 1000 mg/200 mL premix  Status:  Discontinued        1,000 mg 200 mL/hr over 60 Minutes Intravenous  Once 03/06/20 0358 03/06/20 0423      Inpatient Medications  Scheduled Meds: . albuterol  2 puff Inhalation Q6H  . amLODipine  5 mg Oral Daily  . vitamin C  500 mg Oral Daily  . aspirin EC  81 mg Oral Daily  . enoxaparin (LOVENOX) injection  40 mg Subcutaneous Q24H  . insulin aspart  0-15 Units Subcutaneous TID WC  . insulin detemir  8 Units Subcutaneous BID  . methylPREDNISolone (SOLU-MEDROL) injection  40 mg Intravenous Q12H   Followed by  . [START ON 03/10/2020]  predniSONE  50 mg Oral Daily  . metoprolol succinate  75 mg Oral Daily  . QUEtiapine  25 mg Oral BID  . sodium bicarbonate  650 mg Oral BID  . sodium chloride flush  3 mL Intravenous Q12H  . zinc sulfate  220 mg Oral Daily   Continuous Infusions: . azithromycin 500 mg (03/08/20 1005)  . cefTRIAXone (ROCEPHIN)  IV 1 g (03/07/20 1559)  . famotidine (PEPCID) IV 20 mg (03/08/20 1005)  . remdesivir 100 mg in NS 100 mL 100 mg (03/08/20 1001)   PRN Meds:.acetaminophen, acetaminophen, dextrose, guaiFENesin-dextromethorphan, hydrALAZINE, melatonin, ondansetron **OR** ondansetron (ZOFRAN) IV   Time Spent in minutes  35    See all Orders from today for further details   Oren Binet M.D on 03/08/2020 at 1:52 PM  To page go to [email or web address] - use universal password  [facility] -  Office  [phone]    Objective:   Vitals:   03/07/20 2142 03/08/20 0040 03/08/20 0639 03/08/20 0803  BP: (!) 194/106 (!) 162/91 (!) 160/95 (!) 161/81  Pulse: (!) 107 (!) 109 Marland Kitchen)  102 98  Resp: 18  16 20   Temp: 98.6 F (37 C)  98.6 F (37 C) 97.6 F (36.4 C)  TempSrc:    Axillary  SpO2:   93% 91%  Weight:      Height:        Wt Readings from Last 3 Encounters:  03/06/20 97.5 kg     Intake/Output Summary (Last 24 hours) at 03/08/2020 1352 Last data filed at 03/07/2020 1600 Gross per 24 hour  Intake 923.11 ml  Output --  Net 923.11 ml     Physical Exam Gen Exam: Confused-at times follows commands-appears frail/chronically sick appearing.  But not in any distress. HEENT:atraumatic, normocephalic Chest: B/L clear to auscultation anteriorly CVS:S1S2 regular Abdomen:soft non tender, non distended Extremities:no edema Neurology: Nonfocal but with generalized weakness. Skin: no rash   Data Review:    CBC Recent Labs  Lab 03/06/20 0358 03/06/20 0659 03/07/20 0205 03/08/20 0203  WBC 13.9*  --  13.5* 8.9  HGB 14.3 12.9* 13.2 13.9  HCT 43.8 38.0* 38.1* 41.4  PLT 275  --  283  284  MCV 94.0  --  92.0 92.2  MCH 30.7  --  31.9 31.0  MCHC 32.6  --  34.6 33.6  RDW 14.1  --  14.5 14.6  LYMPHSABS 0.5*  --  0.4* 0.3*  MONOABS 0.8  --  0.5 0.3  EOSABS 0.0  --  0.0 0.0  BASOSABS 0.0  --  0.0 0.0    Chemistries  Recent Labs  Lab 03/06/20 0358 03/06/20 0659 03/06/20 1215 03/06/20 1614 03/07/20 0205 03/07/20 0653 03/08/20 0203  NA 135   < > 140 140 140 140 139  K 4.9   < > 4.3 4.7 4.3 4.8 4.4  CL 98   < > 109 110 109 108 107  CO2 16*   < > 19* 18* 14* 17* 16*  GLUCOSE 450*   < > 165* 122* 189* 218* 215*  BUN 20   < > 18 20 26* 31* 36*  CREATININE 1.59*   < > 1.39* 1.32* 1.54* 1.55* 1.36*  CALCIUM 9.1   < > 8.3* 8.2* 8.2* 8.2* 8.1*  MG  --   --   --   --  1.2*  --  1.9  AST 46*  --   --   --  70*  --  138*  ALT 33  --   --   --  32  --  50*  ALKPHOS 114  --   --   --  84  --  74  BILITOT 0.7  --   --   --  1.0  --  0.8   < > = values in this interval not displayed.   ------------------------------------------------------------------------------------------------------------------ No results for input(s): CHOL, HDL, LDLCALC, TRIG, CHOLHDL, LDLDIRECT in the last 72 hours.  Lab Results  Component Value Date   HGBA1C 8.7 (H) 03/06/2020   ------------------------------------------------------------------------------------------------------------------ No results for input(s): TSH, T4TOTAL, T3FREE, THYROIDAB in the last 72 hours.  Invalid input(s): FREET3 ------------------------------------------------------------------------------------------------------------------ Recent Labs    03/06/20 0820 03/07/20 0205  FERRITIN 176 217    Coagulation profile Recent Labs  Lab 03/06/20 0358  INR 1.1    Recent Labs    03/07/20 0205 03/08/20 0203  DDIMER 9.77* 3.34*    Cardiac Enzymes No results for input(s): CKMB, TROPONINI, MYOGLOBIN in the last 168 hours.  Invalid input(s):  CK ------------------------------------------------------------------------------------------------------------------    Component Value Date/Time   BNP 87.3 03/06/2020 0820  Micro Results Recent Results (from the past 240 hour(s))  SARS Coronavirus 2 by RT PCR (hospital order, performed in [facility] hospital lab) Nasopharyngeal Nasopharyngeal Swab     Status: Abnormal   Collection Time: 03/06/20  3:58 AM   Specimen: Nasopharyngeal Swab  Result Value Ref Range Status   SARS Coronavirus 2 POSITIVE (A) NEGATIVE Final    Comment: RESULT CALLED TO, READ BACK BY AND VERIFIED WITH: K GIBSON RN 03/06/20 0538 JDW (NOTE) SARS-CoV-2 target nucleic acids are DETECTED  SARS-CoV-2 RNA is generally detectable in upper respiratory specimens  during the acute phase of infection.  Positive results are indicative  of the presence of the identified virus, but do not rule out bacterial infection or co-infection with other pathogens not detected by the test.  Clinical correlation with patient history and  other diagnostic information is necessary to determine patient infection status.  The expected result is negative.  Fact Sheet for Patients:   BoilerBrush.com.cy   Fact Sheet for Healthcare Providers:   https://pope.com/    This test is not yet approved or cleared by the [place] FDA and  has been authorized for detection and/or diagnosis of SARS-CoV-2 by FDA under an Emergency Use Authorization (EUA).  This EUA will remain in effect (meaning this test can  be used) for the duration of  the COVID-19 declaration under Section 564(b)(1) of the Act, 21 U.S.C. section 360-bbb-3(b)(1), unless the authorization is terminated or revoked sooner.  Performed at [facility], [address], [place], [place] [address]   Blood Culture (routine x 2)     Status: None (Preliminary result)   Collection Time: 03/06/20  3:58 AM   Specimen: BLOOD  RIGHT WRIST  Result Value Ref Range Status   Specimen Description BLOOD RIGHT WRIST  Final   Special Requests   Final    BOTTLES DRAWN AEROBIC AND ANAEROBIC Blood Culture results may not be optimal due to an inadequate volume of blood received in culture bottles   Culture   Final    NO GROWTH 2 DAYS Performed at [facility], [address] [place], [place] [address]    Report Status PENDING  Incomplete  Urine culture     Status: None   Collection Time: 03/06/20  3:58 AM   Specimen: In/Out Cath Urine  Result Value Ref Range Status   Specimen Description IN/OUT CATH URINE  Final   Special Requests NONE  Final   Culture   Final    NO GROWTH Performed at [facility], [address] [place], [place] [address]    Report Status 03/07/2020 FINAL  Final  Blood Culture (routine x 2)     Status: None (Preliminary result)   Collection Time: 03/06/20  4:20 AM   Specimen: BLOOD RIGHT HAND  Result Value Ref Range Status   Specimen Description BLOOD RIGHT HAND  Final   Special Requests   Final    BOTTLES DRAWN AEROBIC AND ANAEROBIC Blood Culture results may not be optimal due to an inadequate volume of blood received in culture bottles   Culture   Final    NO GROWTH 2 DAYS Performed at [facility], [address], [place], [place] [address]    Report Status PENDING  Incomplete    Radiology Reports DG Chest Port 1 View  Result Date: 03/06/2020 CLINICAL DATA:  Questionable sepsis EXAM: PORTABLE CHEST 1 VIEW COMPARISON:  10/30/2006 FINDINGS: Infiltrate at the left lung base. Accentuation  of markings at the right base which may be atelectasis in the setting of low lung volumes. Cardiomegaly which is stable. No pulmonary edema, effusion, or pneumothorax. IMPRESSION: Low volume chest with infiltrate at the left base. Electronically Signed   By: Monte Fantasia M.D.   On: 03/06/2020 04:13   DG Chest Port 1V same Day  Result Date: 03/07/2020 CLINICAL DATA:  Weakness. EXAM:  PORTABLE CHEST 1 VIEW COMPARISON:  March 06, 2020. FINDINGS: Stable cardiomediastinal silhouette. Patchy airspace opacities are noted bilaterally consistent with multifocal pneumonia. No pneumothorax pleural effusion is noted. Bony thorax is unremarkable. IMPRESSION: Bilateral multifocal pneumonia. Electronically Signed   By: Marijo Conception M.D.   On: 03/07/2020 09:24   VAS [place] LOWER EXTREMITY VENOUS (DVT)  Result Date: 03/07/2020  Lower Venous DVT Study Indications: Covid-19, elevated D-Dimer.  Limitations: Patient with altered mental status and constant movement. Comparison Study: No prior study on file Performing Technologist: Sharion Dove RVS  Examination Guidelines: A complete evaluation includes B-mode imaging, spectral Doppler, color Doppler, and power Doppler as needed of all accessible portions of each vessel. Bilateral testing is considered an integral part of a complete examination. Limited examinations for reoccurring indications may be performed as noted. The reflux portion of the exam is performed with the patient in reverse Trendelenburg.  +---------+---------------+---------+-----------+----------+-------------------+ RIGHT    CompressibilityPhasicitySpontaneityPropertiesThrombus Aging      +---------+---------------+---------+-----------+----------+-------------------+ CFV      Full           Yes      Yes                                      +---------+---------------+---------+-----------+----------+-------------------+ SFJ      Full                                                             +---------+---------------+---------+-----------+----------+-------------------+ FV Prox  Full                                                             +---------+---------------+---------+-----------+----------+-------------------+ FV Mid   Full                                                              +---------+---------------+---------+-----------+----------+-------------------+ FV DistalFull                                                             +---------+---------------+---------+-----------+----------+-------------------+ PFV      Full                                                             +---------+---------------+---------+-----------+----------+-------------------+  POP      Full           Yes      Yes                                      +---------+---------------+---------+-----------+----------+-------------------+ PTV      Full                                                             +---------+---------------+---------+-----------+----------+-------------------+ PERO                                                  Not well visualized +---------+---------------+---------+-----------+----------+-------------------+   +---------+---------------+---------+-----------+----------+-------------------+ LEFT     CompressibilityPhasicitySpontaneityPropertiesThrombus Aging      +---------+---------------+---------+-----------+----------+-------------------+ CFV      Full           Yes      Yes                                      +---------+---------------+---------+-----------+----------+-------------------+ SFJ      Full                                                             +---------+---------------+---------+-----------+----------+-------------------+ FV Prox  Full           Yes      Yes                                      +---------+---------------+---------+-----------+----------+-------------------+ FV Mid   Full                                                             +---------+---------------+---------+-----------+----------+-------------------+ FV DistalFull                                                             +---------+---------------+---------+-----------+----------+-------------------+  PFV      Full                                                             +---------+---------------+---------+-----------+----------+-------------------+ POP      Full  Yes      Yes                                      +---------+---------------+---------+-----------+----------+-------------------+ PTV      Full                                                             +---------+---------------+---------+-----------+----------+-------------------+ PERO                                                  Not well visualized +---------+---------------+---------+-----------+----------+-------------------+     Summary: RIGHT: - There is no evidence of deep vein thrombosis in the lower extremity. However, portions of this examination were limited- see technologist comments above.  LEFT: - There is no evidence of deep vein thrombosis in the lower extremity. However, portions of this examination were limited- see technologist comments above.  *See table(s) above for measurements and observations.    Preliminary

## 2020-03-08 NOTE — Progress Notes (Signed)
Telephone consent obtained from dtr, Sheliah Plane, for PICC placement.

## 2020-03-09 DIAGNOSIS — N179 Acute kidney failure, unspecified: Secondary | ICD-10-CM | POA: Diagnosis not present

## 2020-03-09 DIAGNOSIS — U071 COVID-19: Secondary | ICD-10-CM | POA: Diagnosis not present

## 2020-03-09 DIAGNOSIS — J9601 Acute respiratory failure with hypoxia: Secondary | ICD-10-CM | POA: Diagnosis not present

## 2020-03-09 DIAGNOSIS — E1111 Type 2 diabetes mellitus with ketoacidosis with coma: Secondary | ICD-10-CM | POA: Diagnosis not present

## 2020-03-09 LAB — GLUCOSE, CAPILLARY
Glucose-Capillary: 173 mg/dL — ABNORMAL HIGH (ref 70–99)
Glucose-Capillary: 187 mg/dL — ABNORMAL HIGH (ref 70–99)
Glucose-Capillary: 226 mg/dL — ABNORMAL HIGH (ref 70–99)
Glucose-Capillary: 200 mg/dL — ABNORMAL HIGH (ref 70–99)

## 2020-03-09 LAB — COMPREHENSIVE METABOLIC PANEL
ALT: 53 U/L — ABNORMAL HIGH (ref 0–44)
AST: 103 U/L — ABNORMAL HIGH (ref 15–41)
Albumin: 2.5 g/dL — ABNORMAL LOW (ref 3.5–5.0)
Alkaline Phosphatase: 82 U/L (ref 38–126)
Anion gap: 13 (ref 5–15)
BUN: 40 mg/dL — ABNORMAL HIGH (ref 8–23)
CO2: 21 mmol/L — ABNORMAL LOW (ref 22–32)
Calcium: 8.2 mg/dL — ABNORMAL LOW (ref 8.9–10.3)
Chloride: 108 mmol/L (ref 98–111)
Creatinine, Ser: 1.3 mg/dL — ABNORMAL HIGH (ref 0.61–1.24)
GFR, Estimated: 52 mL/min — ABNORMAL LOW (ref >60)
Glucose, Bld: 196 mg/dL — ABNORMAL HIGH (ref 70–99)
Potassium: 4.3 mmol/L (ref 3.5–5.1)
Sodium: 142 mmol/L (ref 135–145)
Total Bilirubin: 0.6 mg/dL (ref 0.3–1.2)
Total Protein: 5.6 g/dL — ABNORMAL LOW (ref 6.5–8.1)

## 2020-03-09 LAB — CBC WITH DIFFERENTIAL/PLATELET
Abs Immature Granulocytes: 0.05 10*3/uL (ref 0.00–0.07)
Basophils Absolute: 0 10*3/uL (ref 0.0–0.1)
Basophils Relative: 0 %
Eosinophils Absolute: 0 10*3/uL (ref 0.0–0.5)
Eosinophils Relative: 0 %
HCT: 42.1 % (ref 39.0–52.0)
Hemoglobin: 14.3 g/dL (ref 13.0–17.0)
Immature Granulocytes: 1 %
Lymphocytes Relative: 6 %
Lymphs Abs: 0.6 10*3/uL — ABNORMAL LOW (ref 0.7–4.0)
MCH: 31.6 pg (ref 26.0–34.0)
MCHC: 34 g/dL (ref 30.0–36.0)
MCV: 92.9 fL (ref 80.0–100.0)
Monocytes Absolute: 0.4 10*3/uL (ref 0.1–1.0)
Monocytes Relative: 4 %
Neutro Abs: 8.8 10*3/uL — ABNORMAL HIGH (ref 1.7–7.7)
Neutrophils Relative %: 89 %
Platelets: 305 10*3/uL (ref 150–400)
RBC: 4.53 MIL/uL (ref 4.22–5.81)
RDW: 14.8 % (ref 11.5–15.5)
WBC: 9.8 10*3/uL (ref 4.0–10.5)
nRBC: 0 % (ref 0.0–0.2)

## 2020-03-09 LAB — MAGNESIUM: Magnesium: 2.2 mg/dL (ref 1.7–2.4)

## 2020-03-09 LAB — D-DIMER, QUANTITATIVE: D-Dimer, Quant: 2.52 ug/mL-FEU — ABNORMAL HIGH (ref 0.00–0.50)

## 2020-03-09 LAB — PROCALCITONIN: Procalcitonin: 2.15 ng/mL

## 2020-03-09 LAB — C-REACTIVE PROTEIN: CRP: 5.3 mg/dL — ABNORMAL HIGH (ref <1.0)

## 2020-03-09 MED ORDER — AMLODIPINE BESYLATE 10 MG PO TABS
10.0000 mg | ORAL_TABLET | Freq: Every day | ORAL | Status: DC
  Administered 2020-03-10 – 2020-03-17 (×8): 10 mg via ORAL
  Filled 2020-03-09 (×8): qty 1

## 2020-03-09 MED ORDER — PREDNISONE 5 MG PO TABS
30.0000 mg | ORAL_TABLET | Freq: Every day | ORAL | Status: DC
  Administered 2020-03-10: 30 mg via ORAL
  Filled 2020-03-09: qty 1

## 2020-03-09 MED ORDER — PREDNISONE 5 MG PO TABS: 30.0000 mg | ORAL_TABLET | Freq: Every day | ORAL | Status: DC

## 2020-03-09 MED ORDER — RESOURCE THICKENUP CLEAR PO POWD
ORAL | Status: DC | PRN
  Filled 2020-03-09: qty 125

## 2020-03-09 NOTE — Progress Notes (Signed)
PROGRESS NOTE                                                                                                                                                                                                             Patient Demographics:    Joe [name], is a 87 y.o. male, DOB - [birth year], HRC:[id number]  Outpatient Primary MD for the patient is Patient, No Pcp Per   Admit date - 03/06/2020   LOS - 3  Chief Complaint  Patient presents with  . Emesis       Brief Narrative: Patient is a 87 y.o. male with PMHx of HTN, DM-2, solitary kidney-presenting with confusion/nausea and vomiting-found to have acute metabolic encephalopathy, acute hypoxic respiratory failure, DKA due to COVID-19 infection. See below for further details.   COVID-19 vaccinated status:   Significant Events: 2/5>> Admit to [facility] for sepsis/hypoxia (on 6 L of HFNC)-due to COVID-19, AKI and DKA  Significant studies: 2/4>> chest x-ray: Left base infiltrate 2/5>>Chest x-ray: Bilateral multifocal pneumonia 2/5>> bilateral lower extremity Doppler: No DVT.  COVID-19 medications: Steroids: 2/4>> Remdesivir: 2/4>> Actemra: X1 on 2/4  Antibiotics: Zithromax: 2/4>>2/6 Rocephin: 2/4>> Cefepime: 2/4 x 1 Flagyl: 2/4 x 1 Vancomycin: 2/4 x 1  Microbiology data: 2/4 >>blood culture: No growth 2/4>> urine culture: No growth  Procedures: None  Consults: None  DVT prophylaxis: enoxaparin (LOVENOX) injection 40 mg Start: 03/06/20 0900    Subjective:   More alert this morning compared to the past few days.  Answering some questions appropriately.   Assessment  & Plan :   Acute Hypoxic Resp Failure due to Covid 19 Viral pneumonia +/-bacterial pneumonia: Hypoxia has improved-on minimal-2-to 3 L of oxygen.  CRP downtrending.  Continue to taper steroids-on Remdesivir-Rocephin.  Have stopped Zithromax today.  Continued attempts to titrate down oxygen.     Fever: Afebrile. O2 requirements:  SpO2: 98 % O2 Flow Rate (L/min): 3 L/min   COVID-19 Labs: Recent Labs    03/07/20 0205 03/08/20 0203 03/09/20 0433  DDIMER 9.77* 3.34* 2.52*  FERRITIN 217  --   --   CRP 16.0* 10.0* 5.3*       Component Value Date/Time   BNP 87.3 03/06/2020 0820    Recent Labs  Lab 03/06/20 0820 03/07/20 0653 03/08/20 0203 03/09/20 0433  PROCALCITON 4.05 5.74 3.75 2.15  Lab Results  Component Value Date   SARSCOV2NAA POSITIVE (A) 03/06/2020     Prone/Incentive Spirometry: Encouraged incentive spirometry use-but confused-will not follow commands.  Elevated D-dimer: Likely due to COVID-19 related inflammation-lower extremity Dopplers negative-with improving hypoxemia-doubt further work-up is required.  On prophylactic Lovenox.  Sepsis due to COVID-19 pneumonia +/-bacterial pneumonia: Sepsis physiology has significantly improved-blood cultures negative so far-procalcitonin downtrending-on antimicrobial therapy as above.  Cultures negative so far.    Acute metabolic encephalopathy: Multifactorial-from sepsis/hypoxemia/DKA/steroid use-has significant hearing impairment at baseline-probably has some amount of cognitive dysfunction as well.  He is more awake then the past few days-still confused but able to follow simple commands.  Continue Seroquel nightly.  Have ordered a bedside sitter-avoid restraints as much as possible.  DKA: Overall improved-transitioned off insulin infusion-on SQ insulin. Continues to have some mild metabolic acidosis which could be from AKI/potential chronic RTA.  Acute kidney injury: Likely hemodynamically mediated-No recent labs to compare with-labs in 2008 were normal. Suspect given history of congenital solitary kidney-May have developed some amount of CKD at baseline.   Hypomagnesemia: Improved-continue to replete and recheck.  HTN: BP still on the higher side-increase amlodipine to 10 mg-continue metoprolol.  Follow  and adjust over the next few days.    Insulin-dependent DM-2 (A1c 8.7 on 2/4): CBGs relatively stable-allow some permissive hyperglycemia-not a candidate for strict glycemic control-continue Levemir 8 units twice daily and SSI.  Follow and adjust.     Recent Labs    03/08/20 1829 03/08/20 2047 03/09/20 0744  GLUCAP 215* 164* 173*   Deconditioning/debility: Due to acute illness-evaluated by rehab services-recommendations of SNF.  Hard of hearing  Obesity: Estimated body mass index is 30.85 kg/m as calculated from the following:   Height as of this encounter: 5\' 10"  (1.778 m).   Weight as of this encounter: 97.5 kg.     GI prophylaxis: H2 Blocker  ABG:    Component Value Date/Time   HCO3 16.6 (L) 03/06/2020 0659   TCO2 18 (L) 03/06/2020 0659   ACIDBASEDEF 9.0 (H) 03/06/2020 0659   O2SAT 100.0 03/06/2020 0659    Vent Settings: N/A    Condition - Extremely Guarded  Family Communication  :  Daughter-Ann-[phone] left voicemial on 2/7  Code Status :  DNR  Diet :  Diet Order            Diet Carb Modified Fluid consistency: Thin; Room service appropriate? Yes with Assist  Diet effective now                  Disposition Plan  :   Status is: Inpatient Remains inpatient appropriate because:Inpatient level of care appropriate due to severity of illness   Dispo: The patient is from: Home              Anticipated d/c is to: TBD              Anticipated d/c date is: > 3 days              Patient currently is not medically stable to d/c.   Difficult to place patient No   Barriers to discharge: Hypoxia requiring O2 supplementation/complete 5 days of IV Remdesivir  Antimicorbials  :    Anti-infectives (From admission, onward)   Start     Dose/Rate Route Frequency Ordered Stop   03/07/20 1000  remdesivir 100 mg in sodium chloride 0.9 % 100 mL IVPB       "Followed by" Linked Group Details  100 mg 200 mL/hr over 30 Minutes Intravenous Daily 03/06/20 0555  03/11/20 0959   03/06/20 1600  cefTRIAXone (ROCEPHIN) 1 g in sodium chloride 0.9 % 100 mL IVPB        1 g 200 mL/hr over 30 Minutes Intravenous Every 24 hours 03/06/20 0810 03/10/20 2359   03/06/20 1400  azithromycin (ZITHROMAX) 500 mg in sodium chloride 0.9 % 250 mL IVPB  Status:  Discontinued        500 mg 250 mL/hr over 60 Minutes Intravenous Every 24 hours 03/06/20 0810 03/06/20 0837   03/06/20 0900  azithromycin (ZITHROMAX) 500 mg in sodium chloride 0.9 % 250 mL IVPB        500 mg 250 mL/hr over 60 Minutes Intravenous Every 24 hours 03/06/20 0837 03/10/20 2359   03/06/20 0700  remdesivir 200 mg in sodium chloride 0.9% 250 mL IVPB       "Followed by" Linked Group Details   200 mg 580 mL/hr over 30 Minutes Intravenous Once 03/06/20 0555 03/06/20 0835   03/06/20 0430  vancomycin (VANCOREADY) IVPB 2000 mg/400 mL        2,000 mg 200 mL/hr over 120 Minutes Intravenous  Once 03/06/20 0423 03/06/20 0713   03/06/20 0400  ceFEPIme (MAXIPIME) 2 g in sodium chloride 0.9 % 100 mL IVPB        2 g 200 mL/hr over 30 Minutes Intravenous  Once 03/06/20 0358 03/06/20 0452   03/06/20 0400  metroNIDAZOLE (FLAGYL) IVPB 500 mg        500 mg 100 mL/hr over 60 Minutes Intravenous  Once 03/06/20 0358 03/06/20 0622   03/06/20 0400  vancomycin (VANCOCIN) IVPB 1000 mg/200 mL premix  Status:  Discontinued        1,000 mg 200 mL/hr over 60 Minutes Intravenous  Once 03/06/20 0358 03/06/20 0423      Inpatient Medications  Scheduled Meds: . albuterol  2 puff Inhalation Q6H  . amLODipine  5 mg Oral Daily  . vitamin C  500 mg Oral Daily  . aspirin EC  81 mg Oral Daily  . Chlorhexidine Gluconate Cloth  6 each Topical Daily  . enoxaparin (LOVENOX) injection  40 mg Subcutaneous Q24H  . insulin aspart  0-15 Units Subcutaneous TID WC  . insulin detemir  8 Units Subcutaneous BID  . methylPREDNISolone (SOLU-MEDROL) injection  40 mg Intravenous Q12H   Followed by  . [START ON 03/10/2020] predniSONE  50 mg Oral  Daily  . metoprolol succinate  75 mg Oral Daily  . QUEtiapine  25 mg Oral QHS  . sodium chloride flush  3 mL Intravenous Q12H  . zinc sulfate  220 mg Oral Daily   Continuous Infusions: . azithromycin 500 mg (03/08/20 1005)  . cefTRIAXone (ROCEPHIN)  IV 1 g (03/08/20 1614)  . famotidine (PEPCID) IV 20 mg (03/08/20 2228)  . remdesivir 100 mg in NS 100 mL 100 mg (03/08/20 1001)  . sodium chloride 0.45 % 1,000 mL with sodium bicarbonate 100 mEq infusion 50 mL/hr at 03/08/20 1624   PRN Meds:.acetaminophen, acetaminophen, dextrose, guaiFENesin-dextromethorphan, hydrALAZINE, melatonin, ondansetron **OR** ondansetron (ZOFRAN) IV, sodium chloride flush   Time Spent in minutes  25    See all Orders from today for further details   Oren Binet M.D on 03/09/2020 at 10:50 AM  To page go to [email or web address] - use universal password  [facility] -  Office  [phone]    Objective:   Vitals:   03/08/20 [id number] 03/08/20 0803 03/08/20 2020 03/09/20 0435  BP: (!) 160/95 (!) 161/81 (!) 171/85 (!) 159/92  Pulse: (!) 102 98 97 80  Resp: 16 20 20 18   Temp: 98.6 F (37 C) 97.6 F (36.4 C) 98 F (36.7 C) 97.7 F (36.5 C)  TempSrc:  Axillary Axillary Axillary  SpO2: 93% 91% 90% 98%  Weight:      Height:        Wt Readings from Last 3 Encounters:  03/06/20 97.5 kg     Intake/Output Summary (Last 24 hours) at 03/09/2020 1050 Last data filed at 03/09/2020 [id number] Gross per 24 hour  Intake 700 ml  Output 200 ml  Net 500 ml     Physical Exam Gen Exam: More alert compared to yesterday-answering some questions appropriately-appears much better than the past few days.  HEENT:atraumatic, normocephalic Chest: B/L clear to auscultation anteriorly CVS:S1S2 regular Abdomen:soft non tender, non distended Extremities:no edema Neurology: Moving all 4 extremities-nonfocal. Skin: no rash   Data Review:    CBC Recent Labs  Lab 03/06/20 0358 03/06/20 0659 03/07/20 0205 03/08/20 0203  03/09/20 0433  WBC 13.9*  --  13.5* 8.9 9.8  HGB 14.3 12.9* 13.2 13.9 14.3  HCT 43.8 38.0* 38.1* 41.4 42.1  PLT 275  --  283 284 305  MCV 94.0  --  92.0 92.2 92.9  MCH 30.7  --  31.9 31.0 31.6  MCHC 32.6  --  34.6 33.6 34.0  RDW 14.1  --  14.5 14.6 14.8  LYMPHSABS 0.5*  --  0.4* 0.3* 0.6*  MONOABS 0.8  --  0.5 0.3 0.4  EOSABS 0.0  --  0.0 0.0 0.0  BASOSABS 0.0  --  0.0 0.0 0.0    Chemistries  Recent Labs  Lab 03/06/20 0358 03/06/20 0659 03/06/20 1614 03/07/20 0205 03/07/20 0653 03/08/20 0203 03/09/20 0433  NA 135   < > 140 140 140 139 142  K 4.9   < > 4.7 4.3 4.8 4.4 4.3  CL 98   < > 110 109 108 107 108  CO2 16*   < > 18* 14* 17* 16* 21*  GLUCOSE 450*   < > 122* 189* 218* 215* 196*  BUN 20   < > 20 26* 31* 36* 40*  CREATININE 1.59*   < > 1.32* 1.54* 1.55* 1.36* 1.30*  CALCIUM 9.1   < > 8.2* 8.2* 8.2* 8.1* 8.2*  MG  --   --   --  1.2*  --  1.9 2.2  AST 46*  --   --  70*  --  138* 103*  ALT 33  --   --  32  --  50* 53*  ALKPHOS 114  --   --  84  --  74 82  BILITOT 0.7  --   --  1.0  --  0.8 0.6   < > = values in this interval not displayed.   ------------------------------------------------------------------------------------------------------------------ No results for input(s): CHOL, HDL, LDLCALC, TRIG, CHOLHDL, LDLDIRECT in the last 72 hours.  Lab Results  Component Value Date   HGBA1C 8.7 (H) 03/06/2020   ------------------------------------------------------------------------------------------------------------------ No results for input(s): TSH, T4TOTAL, T3FREE, THYROIDAB in the last 72 hours.  Invalid input(s): FREET3 ------------------------------------------------------------------------------------------------------------------ Recent Labs    03/07/20 0205  FERRITIN 217    Coagulation profile Recent Labs  Lab 03/06/20 0358  INR 1.1    Recent Labs    03/08/20 0203 03/09/20 0433  DDIMER 3.34* 2.52*    Cardiac Enzymes No results for  input(s): CKMB, TROPONINI, MYOGLOBIN in  the last 168 hours.  Invalid input(s): CK ------------------------------------------------------------------------------------------------------------------    Component Value Date/Time   BNP 87.3 03/06/2020 0820    Micro Results Recent Results (from the past 240 hour(s))  SARS Coronavirus 2 by RT PCR (hospital order, performed in [facility] hospital lab) Nasopharyngeal Nasopharyngeal Swab     Status: Abnormal   Collection Time: 03/06/20  3:58 AM   Specimen: Nasopharyngeal Swab  Result Value Ref Range Status   SARS Coronavirus 2 POSITIVE (A) NEGATIVE Final    Comment: RESULT CALLED TO, READ BACK BY AND VERIFIED WITH: K GIBSON RN 03/06/20 0538 JDW (NOTE) SARS-CoV-2 target nucleic acids are DETECTED  SARS-CoV-2 RNA is generally detectable in upper respiratory specimens  during the acute phase of infection.  Positive results are indicative  of the presence of the identified virus, but do not rule out bacterial infection or co-infection with other pathogens not detected by the test.  Clinical correlation with patient history and  other diagnostic information is necessary to determine patient infection status.  The expected result is negative.  Fact Sheet for Patients:   StrictlyIdeas.no   Fact Sheet for Healthcare Providers:   BankingDealers.co.za    This test is not yet approved or cleared by the [place] FDA and  has been authorized for detection and/or diagnosis of SARS-CoV-2 by FDA under an Emergency Use Authorization (EUA).  This EUA will remain in effect (meaning this test can  be used) for the duration of  the COVID-19 declaration under Section 564(b)(1) of the Act, 21 U.S.C. section 360-bbb-3(b)(1), unless the authorization is terminated or revoked sooner.  Performed at [facility], [place] [address], [place], [place] [address]   Blood Culture (routine x 2)     Status: None  (Preliminary result)   Collection Time: 03/06/20  3:58 AM   Specimen: BLOOD RIGHT WRIST  Result Value Ref Range Status   Specimen Description BLOOD RIGHT WRIST  Final   Special Requests   Final    BOTTLES DRAWN AEROBIC AND ANAEROBIC Blood Culture results may not be optimal due to an inadequate volume of blood received in culture bottles   Culture   Final    NO GROWTH 3 DAYS Performed at [facility], [place] [address], [place], [place] [address]    Report Status PENDING  Incomplete  Urine culture     Status: None   Collection Time: 03/06/20  3:58 AM   Specimen: In/Out Cath Urine  Result Value Ref Range Status   Specimen Description IN/OUT CATH URINE  Final   Special Requests NONE  Final   Culture   Final    NO GROWTH Performed at [facility], [place] [address], [place], [place] [address]    Report Status 03/07/2020 FINAL  Final  Blood Culture (routine x 2)     Status: None (Preliminary result)   Collection Time: 03/06/20  4:20 AM   Specimen: BLOOD RIGHT HAND  Result Value Ref Range Status   Specimen Description BLOOD RIGHT HAND  Final   Special Requests   Final    BOTTLES DRAWN AEROBIC AND ANAEROBIC Blood Culture results may not be optimal due to an inadequate volume of blood received in culture bottles   Culture   Final    NO GROWTH 3 DAYS Performed at [facility], [place] [address], [place],  [address]    Report Status PENDING  Incomplete    Radiology Reports DG Chest [facility] [address]  Result Date: 03/06/2020 CLINICAL DATA:  Questionable sepsis EXAM: PORTABLE CHEST 1 VIEW COMPARISON:  10/30/2006 FINDINGS: Infiltrate at the left lung base. Accentuation of markings at the right base which may be atelectasis in the setting of low lung volumes. Cardiomegaly which is stable. No pulmonary edema, effusion, or pneumothorax. IMPRESSION: Low volume chest with infiltrate at the left base. Electronically Signed   By: Monte Fantasia M.D.   On: 03/06/2020 04:13   DG Chest  Port 1V same Day  Result Date: 03/07/2020 CLINICAL DATA:  Weakness. EXAM: PORTABLE CHEST 1 VIEW COMPARISON:  March 06, 2020. FINDINGS: Stable cardiomediastinal silhouette. Patchy airspace opacities are noted bilaterally consistent with multifocal pneumonia. No pneumothorax pleural effusion is noted. Bony thorax is unremarkable. IMPRESSION: Bilateral multifocal pneumonia. Electronically Signed   By: Marijo Conception M.D.   On: 03/07/2020 09:24   VAS [place] LOWER EXTREMITY VENOUS (DVT)  Result Date: 03/08/2020  Lower Venous DVT Study Indications: Covid-19, elevated D-Dimer.  Limitations: Patient with altered mental status and constant movement. Comparison Study: No prior study on file Performing Technologist: Sharion Dove RVS  Examination Guidelines: A complete evaluation includes B-mode imaging, spectral Doppler, color Doppler, and power Doppler as needed of all accessible portions of each vessel. Bilateral testing is considered an integral part of a complete examination. Limited examinations for reoccurring indications may be performed as noted. The reflux portion of the exam is performed with the patient in reverse Trendelenburg.  +---------+---------------+---------+-----------+----------+-------------------+ RIGHT    CompressibilityPhasicitySpontaneityPropertiesThrombus Aging      +---------+---------------+---------+-----------+----------+-------------------+ CFV      Full           Yes      Yes                                      +---------+---------------+---------+-----------+----------+-------------------+ SFJ      Full                                                             +---------+---------------+---------+-----------+----------+-------------------+ FV Prox  Full                                                             +---------+---------------+---------+-----------+----------+-------------------+ FV Mid   Full                                                              +---------+---------------+---------+-----------+----------+-------------------+ FV DistalFull                                                             +---------+---------------+---------+-----------+----------+-------------------+ PFV      Full                                                             +---------+---------------+---------+-----------+----------+-------------------+  POP      Full           Yes      Yes                                      +---------+---------------+---------+-----------+----------+-------------------+ PTV      Full                                                             +---------+---------------+---------+-----------+----------+-------------------+ PERO                                                  Not well visualized +---------+---------------+---------+-----------+----------+-------------------+   +---------+---------------+---------+-----------+----------+-------------------+ LEFT     CompressibilityPhasicitySpontaneityPropertiesThrombus Aging      +---------+---------------+---------+-----------+----------+-------------------+ CFV      Full           Yes      Yes                                      +---------+---------------+---------+-----------+----------+-------------------+ SFJ      Full                                                             +---------+---------------+---------+-----------+----------+-------------------+ FV Prox  Full           Yes      Yes                                      +---------+---------------+---------+-----------+----------+-------------------+ FV Mid   Full                                                             +---------+---------------+---------+-----------+----------+-------------------+ FV DistalFull                                                              +---------+---------------+---------+-----------+----------+-------------------+ PFV      Full                                                             +---------+---------------+---------+-----------+----------+-------------------+ POP      Full  Yes      Yes                                      +---------+---------------+---------+-----------+----------+-------------------+ PTV      Full                                                             +---------+---------------+---------+-----------+----------+-------------------+ PERO                                                  Not well visualized +---------+---------------+---------+-----------+----------+-------------------+    Summary: RIGHT: - There is no evidence of deep vein thrombosis in the lower extremity. However, portions of this examination were limited- see technologist comments above.  LEFT: - There is no evidence of deep vein thrombosis in the lower extremity. However, portions of this examination were limited- see technologist comments above.  *See table(s) above for measurements and observations. Electronically signed by Monica Martinez MD on 03/08/2020 at 2:27:10 PM.    Final    [place] EKG SITE RITE  Result Date: 03/08/2020 If Site Rite image not attached, placement could not be confirmed due to current cardiac rhythm.

## 2020-03-09 NOTE — Evaluation (Signed)
Clinical/Bedside Swallow Evaluation Patient Details  Name: Joe [name] MRN: [id number] Date of Birth: [birth year]  Today's Date: 03/09/2020 Time: SLP Start Time (ACUTE ONLY): 88 SLP Stop Time (ACUTE ONLY): 1549 SLP Time Calculation (min) (ACUTE ONLY): 19 min  Past Medical History:  Past Medical History:  Diagnosis Date  . Diabetes mellitus without complication ([place])   . Hypertension   . Solitary kidney, congenital    Past Surgical History: History reviewed. No pertinent surgical history. HPI:  Pt is a 87 y.o. male with medical history significant of hypertension, diabetes mellitus type II, and solitary kidney who presented with complaints of weakness with nausea and vomiting and was found to have found to have acute metabolic encephalopathy, acute hypoxic respiratory failure, DKA due to COVID-19 infection. CXR 2/5: Bilateral multifocal pneumonia.   Assessment / Plan / Recommendation Clinical Impression  Pt was seen for bedside swallow evaluation. Pt was HOH and exhibited difficulty following commands. A complete oral mechanism exam was not conducted due to pt's difficulty following commands, but strength and ROM appeared grossly functional. Pt demonstrated symptoms of oropharyngeal dysphagia characterized by prolonged mastication, lingual residue, as well as throat clearing and/or coughing with dysphagia 2 solids, regular texture solids, thin liquids and nectar thick liquids. Pt was asymptomatic of aspiration with puree and only occasional throat clearing/coughing were noted with nectar thick liquids. A dysphagia 1(puree) diet with nectar thick liquids is recommended at this time. SLP will follow to assess diet tolerance and for possible instrumental assessment if pt's symptoms persist and this aligns with goals of care. SLP Visit Diagnosis: Dysphagia, unspecified (R13.10)    Aspiration Risk  Mild aspiration risk    Diet Recommendation Dysphagia 1 (Puree);Nectar-thick liquid   Liquid  Administration via: Cup;Straw Medication Administration: Crushed with puree Supervision: Staff to assist with self feeding;Full supervision/cueing for compensatory strategies Compensations: Slow rate;Small sips/bites;Follow solids with liquid Postural Changes: Seated upright at 90 degrees    Other  Recommendations Oral Care Recommendations: Oral care BID Other Recommendations: Order thickener from pharmacy   Follow up Recommendations  (TBD)      Frequency and Duration min 2x/week  1 week       Prognosis Prognosis for Safe Diet Advancement: Fair      Swallow Study   General Date of Onset: 03/08/20 HPI: Pt is a 87 y.o. male with medical history significant of hypertension, diabetes mellitus type II, and solitary kidney who presented with complaints of weakness with nausea and vomiting and was found to have found to have acute metabolic encephalopathy, acute hypoxic respiratory failure, DKA due to COVID-19 infection. CXR 2/5: Bilateral multifocal pneumonia. Type of Study: Bedside Swallow Evaluation Previous Swallow Assessment: None Diet Prior to this Study: Regular;Thin liquids Temperature Spikes Noted: No Respiratory Status: Room air History of Recent Intubation: No Behavior/Cognition: Alert;Confused;Doesn't follow directions Oral Cavity Assessment: Within Functional Limits Oral Care Completed by SLP: No Oral Cavity - Dentition: Edentulous Vision: Functional for self-feeding Self-Feeding Abilities: Needs assist Patient Positioning: Upright in chair;Postural control adequate for testing Baseline Vocal Quality: Normal Volitional Cough: Cognitively unable to elicit Volitional Swallow: Unable to elicit    Oral/Motor/Sensory Function Overall Oral Motor/Sensory Function:  (Pt unable to follow the necessary commands)   Ice Chips Ice chips: Within functional limits   Thin Liquid Thin Liquid: Impaired Presentation: Straw;Cup Pharyngeal  Phase Impairments: Cough - Immediate;Cough -  Delayed    Nectar Thick Nectar Thick Liquid: Impaired Presentation: Straw Pharyngeal Phase Impairments: Throat Clearing - Immediate;Cough - Immediate;Cough - Delayed  Honey Thick Honey Thick Liquid: Not tested   Puree Puree: Within functional limits Presentation: Spoon   Solid     Solid: Impaired Pharyngeal Phase Impairments: Cough - Immediate;Cough - Delayed     Cyd Hostler I. Hardin Negus, [place], [place] Office number [phone] Pager [place] 03/09/2020,5:11 PM

## 2020-03-10 DIAGNOSIS — U071 COVID-19: Secondary | ICD-10-CM | POA: Diagnosis not present

## 2020-03-10 DIAGNOSIS — N179 Acute kidney failure, unspecified: Secondary | ICD-10-CM | POA: Diagnosis not present

## 2020-03-10 DIAGNOSIS — E1111 Type 2 diabetes mellitus with ketoacidosis with coma: Secondary | ICD-10-CM | POA: Diagnosis not present

## 2020-03-10 DIAGNOSIS — J9601 Acute respiratory failure with hypoxia: Secondary | ICD-10-CM | POA: Diagnosis not present

## 2020-03-10 LAB — CBC WITH DIFFERENTIAL/PLATELET
Abs Immature Granulocytes: 0.02 10*3/uL (ref 0.00–0.07)
Basophils Absolute: 0 10*3/uL (ref 0.0–0.1)
Basophils Relative: 0 %
Eosinophils Absolute: 0 10*3/uL (ref 0.0–0.5)
Eosinophils Relative: 1 %
HCT: 38.9 % — ABNORMAL LOW (ref 39.0–52.0)
Hemoglobin: 13.4 g/dL (ref 13.0–17.0)
Immature Granulocytes: 0 %
Lymphocytes Relative: 13 %
Lymphs Abs: 0.6 10*3/uL — ABNORMAL LOW (ref 0.7–4.0)
MCH: 31.8 pg (ref 26.0–34.0)
MCHC: 34.4 g/dL (ref 30.0–36.0)
MCV: 92.4 fL (ref 80.0–100.0)
Monocytes Absolute: 0.8 10*3/uL (ref 0.1–1.0)
Monocytes Relative: 17 %
Neutro Abs: 3.2 10*3/uL (ref 1.7–7.7)
Neutrophils Relative %: 69 %
Platelets: 265 10*3/uL (ref 150–400)
RBC: 4.21 MIL/uL — ABNORMAL LOW (ref 4.22–5.81)
RDW: 14.7 % (ref 11.5–15.5)
WBC: 4.6 10*3/uL (ref 4.0–10.5)
nRBC: 0 % (ref 0.0–0.2)

## 2020-03-10 LAB — COMPREHENSIVE METABOLIC PANEL
ALT: 52 U/L — ABNORMAL HIGH (ref 0–44)
AST: 90 U/L — ABNORMAL HIGH (ref 15–41)
Albumin: 2.4 g/dL — ABNORMAL LOW (ref 3.5–5.0)
Alkaline Phosphatase: 75 U/L (ref 38–126)
Anion gap: 14 (ref 5–15)
BUN: 41 mg/dL — ABNORMAL HIGH (ref 8–23)
CO2: 22 mmol/L (ref 22–32)
Calcium: 8.1 mg/dL — ABNORMAL LOW (ref 8.9–10.3)
Chloride: 107 mmol/L (ref 98–111)
Creatinine, Ser: 1.1 mg/dL (ref 0.61–1.24)
GFR, Estimated: >60 mL/min (ref >60)
Glucose, Bld: 96 mg/dL (ref 70–99)
Potassium: 3.7 mmol/L (ref 3.5–5.1)
Sodium: 143 mmol/L (ref 135–145)
Total Bilirubin: 0.8 mg/dL (ref 0.3–1.2)
Total Protein: 5.1 g/dL — ABNORMAL LOW (ref 6.5–8.1)

## 2020-03-10 LAB — GLUCOSE, CAPILLARY
Glucose-Capillary: 83 mg/dL (ref 70–99)
Glucose-Capillary: 123 mg/dL — ABNORMAL HIGH (ref 70–99)
Glucose-Capillary: 184 mg/dL — ABNORMAL HIGH (ref 70–99)
Glucose-Capillary: 157 mg/dL — ABNORMAL HIGH (ref 70–99)

## 2020-03-10 LAB — MAGNESIUM: Magnesium: 1.8 mg/dL (ref 1.7–2.4)

## 2020-03-10 LAB — D-DIMER, QUANTITATIVE: D-Dimer, Quant: 1.97 ug/mL-FEU — ABNORMAL HIGH (ref 0.00–0.50)

## 2020-03-10 LAB — C-REACTIVE PROTEIN: CRP: 2.6 mg/dL — ABNORMAL HIGH (ref <1.0)

## 2020-03-10 MED ORDER — INSULIN DETEMIR 100 UNIT/ML ~~LOC~~ SOLN
5.0000 [IU] | Freq: Two times a day (BID) | SUBCUTANEOUS | Status: DC
  Administered 2020-03-10 – 2020-03-11 (×2): 5 [IU] via SUBCUTANEOUS
  Filled 2020-03-10 (×3): qty 0.05

## 2020-03-10 MED ORDER — FAMOTIDINE 20 MG PO TABS
20.0000 mg | ORAL_TABLET | Freq: Two times a day (BID) | ORAL | Status: DC
  Administered 2020-03-10 – 2020-03-17 (×12): 20 mg via ORAL
  Filled 2020-03-10 (×13): qty 1

## 2020-03-10 MED ORDER — MAGNESIUM SULFATE 2 GM/50ML IV SOLN
2.0000 g | Freq: Once | INTRAVENOUS | Status: AC
  Administered 2020-03-10: 2 g via INTRAVENOUS
  Filled 2020-03-10: qty 50

## 2020-03-10 NOTE — Progress Notes (Signed)
PROGRESS NOTE                                                                                                                                                                                                             Patient Demographics:    Joe [name], is a 87 y.o. male, DOB - 1926-04-07, TDS:[id number]  Outpatient Primary MD for the patient is Patient, No Pcp Per   Admit date - 03/06/2020   LOS - 4  Chief Complaint  Patient presents with  . Emesis       Brief Narrative: Patient is a 87 y.o. male with PMHx of HTN, DM-2, solitary kidney-presenting with confusion/nausea and vomiting-found to have acute metabolic encephalopathy, acute hypoxic respiratory failure, DKA due to COVID-19 infection. See below for further details.   COVID-19 vaccinated status:   Significant Events: 2/5>> Admit to [facility] for sepsis/hypoxia (on 6 L of HFNC)-due to COVID-19, AKI and DKA  Significant studies: 2/4>> chest x-ray: Left base infiltrate 2/5>>Chest x-ray: Bilateral multifocal pneumonia 2/5>> bilateral lower extremity Doppler: No DVT.  COVID-19 medications: Steroids: 2/4>> Remdesivir: 2/4>> 2/8 Actemra: X1 on 2/4  Antibiotics: Zithromax: 2/4>>2/6 Rocephin: 2/4>> Cefepime: 2/4 x 1 Flagyl: 2/4 x 1 Vancomycin: 2/4 x 1  Microbiology data: 2/4 >>blood culture: No growth 2/4>> urine culture: No growth  Procedures: None  Consults: None  DVT prophylaxis: enoxaparin (LOVENOX) injection 40 mg Start: 03/06/20 0900    Subjective:   No major issues overnight-remains somewhat confused-following simple commands.   Assessment  & Plan :   Acute Hypoxic Resp Failure due to Covid 19 Viral pneumonia +/-bacterial pneumonia: Still requiring minimal amounts of oxygen-CRP continues to downtrend-completed Remdesivir on 2/8-remains on Rocephin-continue tapering steroids.  Continued attempts to slowly titrate down FiO2.  Fever:  Afebrile. O2 requirements:  SpO2: 98 % O2 Flow Rate (L/min): 3 L/min   COVID-19 Labs: Recent Labs    03/08/20 0203 03/09/20 0433 03/10/20 0500  DDIMER 3.34* 2.52* 1.97*  CRP 10.0* 5.3* 2.6*       Component Value Date/Time   BNP 87.3 03/06/2020 0820    Recent Labs  Lab 03/06/20 0820 03/07/20 0653 03/08/20 0203 03/09/20 0433  PROCALCITON 4.05 5.74 3.75 2.15    Lab Results  Component Value Date   SARSCOV2NAA POSITIVE (A) 03/06/2020     Prone/Incentive Spirometry: Encouraged incentive spirometry use-but  confused-will not follow commands.  Elevated D-dimer: Likely due to COVID-19 related inflammation-lower extremity Dopplers negative-with improving hypoxemia-doubt further work-up is required.  On prophylactic Lovenox.  Sepsis due to COVID-19 pneumonia +/-bacterial pneumonia: Sepsis physiology has resolved-cultures negative so far-antimicrobial therapy as above  Acute metabolic encephalopathy: Multifactorial-from sepsis/hypoxemia/DKA/steroid use-has significant hearing impairment at baseline-probably has some amount of cognitive dysfunction as well.  Remains alert but still somewhat confused-but able to follow simple commands-continue Seroquel nightly.  Have ordered bedside sitter-continue to avoid restraints as much as possible.    DKA: Overall improved-transitioned off insulin infusion-on SQ insulin  Acute kidney injury: Likely hemodynamically mediated-No recent labs to compare with-labs in 2008 were normal. Suspect given history of congenital solitary kidney-May have developed some amount of CKD at baseline.   Hypomagnesemia: Replete and recheck.  HTN: BP stable-continue metoprolol and amlodipine.  Reassess in the next 1-2 days for further adjustment.    Insulin-dependent DM-2 (A1c 8.7 on 2/4): CBGs relatively stable-allow some permissive hyperglycemia-not a candidate for strict glycemic control-continue Levemir 8 units twice daily and SSI.  Follow and adjust.      Recent Labs    03/09/20 2101 03/10/20 0820 03/10/20 1119  GLUCAP 200* 83 123*   Deconditioning/debility failure to thrive syndrome: Due to acute illness-evaluated by rehab services-recommendations of SNF.  Also poor oral intake-remains somewhat confused-although clinically improved-clinically improvement has plateaued for the past few days.  We will continue supportive care-May benefit from a family friend/family member to visit to see if his oral intake will improve if family feeds him.  Hard of hearing  Obesity: Estimated body mass index is 30.85 kg/m as calculated from the following:   Height as of this encounter: 5\' 10"  (1.778 m).   Weight as of this encounter: 97.5 kg.   GI prophylaxis: H2 Blocker  ABG:    Component Value Date/Time   HCO3 16.6 (L) 03/06/2020 0659   TCO2 18 (L) 03/06/2020 0659   ACIDBASEDEF 9.0 (H) 03/06/2020 0659   O2SAT 100.0 03/06/2020 0659    Vent Settings: N/A    Condition - Extremely Guarded  Family Communication  :  Daughter-Ann-[phone] left voicemial on 2/8  Code Status :  DNR  Diet :  Diet Order            DIET - DYS 1 Room service appropriate? Yes with Assist; Fluid consistency: Nectar Thick  Diet effective now                  Disposition Plan  :   Status is: Inpatient Remains inpatient appropriate because:Inpatient level of care appropriate due to severity of illness   Dispo: The patient is from: Home              Anticipated d/c is to: TBD              Anticipated d/c date is: > 3 days              Patient currently is not medically stable to d/c.   Difficult to place patient No   Barriers to discharge: Hypoxia requiring O2 supplementation/complete 5 days of IV Remdesivir  Antimicorbials  :    Anti-infectives (From admission, onward)   Start     Dose/Rate Route Frequency Ordered Stop   03/07/20 1000  remdesivir 100 mg in sodium chloride 0.9 % 100 mL IVPB       "Followed by" Linked Group Details   100  mg 200 mL/hr over 30 Minutes Intravenous Daily  03/06/20 0555 03/10/20 0955   03/06/20 1600  cefTRIAXone (ROCEPHIN) 1 g in sodium chloride 0.9 % 100 mL IVPB        1 g 200 mL/hr over 30 Minutes Intravenous Every 24 hours 03/06/20 0810 03/10/20 2359   03/06/20 1400  azithromycin (ZITHROMAX) 500 mg in sodium chloride 0.9 % 250 mL IVPB  Status:  Discontinued        500 mg 250 mL/hr over 60 Minutes Intravenous Every 24 hours 03/06/20 0810 03/06/20 0837   03/06/20 0900  azithromycin (ZITHROMAX) 500 mg in sodium chloride 0.9 % 250 mL IVPB  Status:  Discontinued        500 mg 250 mL/hr over 60 Minutes Intravenous Every 24 hours 03/06/20 0837 03/09/20 1052   03/06/20 0700  remdesivir 200 mg in sodium chloride 0.9% 250 mL IVPB       "Followed by" Linked Group Details   200 mg 580 mL/hr over 30 Minutes Intravenous Once 03/06/20 0555 03/06/20 0835   03/06/20 0430  vancomycin (VANCOREADY) IVPB 2000 mg/400 mL        2,000 mg 200 mL/hr over 120 Minutes Intravenous  Once 03/06/20 0423 03/06/20 0713   03/06/20 0400  ceFEPIme (MAXIPIME) 2 g in sodium chloride 0.9 % 100 mL IVPB        2 g 200 mL/hr over 30 Minutes Intravenous  Once 03/06/20 0358 03/06/20 0452   03/06/20 0400  metroNIDAZOLE (FLAGYL) IVPB 500 mg        500 mg 100 mL/hr over 60 Minutes Intravenous  Once 03/06/20 0358 03/06/20 0622   03/06/20 0400  vancomycin (VANCOCIN) IVPB 1000 mg/200 mL premix  Status:  Discontinued        1,000 mg 200 mL/hr over 60 Minutes Intravenous  Once 03/06/20 0358 03/06/20 0423      Inpatient Medications  Scheduled Meds: . albuterol  2 puff Inhalation Q6H  . amLODipine  10 mg Oral Daily  . vitamin C  500 mg Oral Daily  . aspirin EC  81 mg Oral Daily  . Chlorhexidine Gluconate Cloth  6 each Topical Daily  . enoxaparin (LOVENOX) injection  40 mg Subcutaneous Q24H  . famotidine  20 mg Oral BID  . insulin aspart  0-15 Units Subcutaneous TID WC  . insulin detemir  8 Units Subcutaneous BID  . metoprolol  succinate  75 mg Oral Daily  . predniSONE  30 mg Oral Q breakfast  . QUEtiapine  25 mg Oral QHS  . sodium chloride flush  3 mL Intravenous Q12H  . zinc sulfate  220 mg Oral Daily   Continuous Infusions: . cefTRIAXone (ROCEPHIN)  IV 1 g (03/09/20 1732)   PRN Meds:.acetaminophen, acetaminophen, dextrose, guaiFENesin-dextromethorphan, hydrALAZINE, melatonin, ondansetron **OR** ondansetron (ZOFRAN) IV, Resource ThickenUp Clear, sodium chloride flush   Time Spent in minutes  25    See all Orders from today for further details   Oren Binet M.D on 03/10/2020 at 12:19 PM  To page go to [email or web address] - use universal password  [facility] -  Office  [phone]    Objective:   Vitals:   03/09/20 0435 03/09/20 1346 03/09/20 2005 03/10/20 0425  BP: (!) 159/92 (!) 145/73 101/79 (!) 158/80  Pulse: 80 89 88 90  Resp: 18 (!) 23 17 20   Temp: 97.7 F (36.5 C) 98.3 F (36.8 C) 97.8 F (36.6 C) (!) 97.3 F (36.3 C)  TempSrc: Axillary Oral Oral Oral  SpO2: 98% 92% 98% 98%  Weight:  Height:        Wt Readings from Last 3 Encounters:  03/06/20 97.5 kg     Intake/Output Summary (Last 24 hours) at 03/10/2020 1219 Last data filed at 03/10/2020 0400 Gross per 24 hour  Intake 390 ml  Output 450 ml  Net -60 ml     Physical Exam Gen Exam: Alert-but confused-not in any distress. HEENT:atraumatic, normocephalic Chest: B/L clear to auscultation anteriorly CVS:S1S2 regular Abdomen:soft non tender, non distended Extremities:no edema Neurology: Moves all 4 extremities.   Skin: no rash   Data Review:    CBC Recent Labs  Lab 03/06/20 0358 03/06/20 0659 03/07/20 0205 03/08/20 0203 03/09/20 0433 03/10/20 0500  WBC 13.9*  --  13.5* 8.9 9.8 4.6  HGB 14.3 12.9* 13.2 13.9 14.3 13.4  HCT 43.8 38.0* 38.1* 41.4 42.1 38.9*  PLT 275  --  283 284 305 265  MCV 94.0  --  92.0 92.2 92.9 92.4  MCH 30.7  --  31.9 31.0 31.6 31.8  MCHC 32.6  --  34.6 33.6 34.0 34.4  RDW 14.1   --  14.5 14.6 14.8 14.7  LYMPHSABS 0.5*  --  0.4* 0.3* 0.6* 0.6*  MONOABS 0.8  --  0.5 0.3 0.4 0.8  EOSABS 0.0  --  0.0 0.0 0.0 0.0  BASOSABS 0.0  --  0.0 0.0 0.0 0.0    Chemistries  Recent Labs  Lab 03/06/20 0358 03/06/20 0659 03/07/20 0205 03/07/20 0653 03/08/20 0203 03/09/20 0433 03/10/20 0500  NA 135   < > 140 140 139 142 143  K 4.9   < > 4.3 4.8 4.4 4.3 3.7  CL 98   < > 109 108 107 108 107  CO2 16*   < > 14* 17* 16* 21* 22  GLUCOSE 450*   < > 189* 218* 215* 196* 96  BUN 20   < > 26* 31* 36* 40* 41*  CREATININE 1.59*   < > 1.54* 1.55* 1.36* 1.30* 1.10  CALCIUM 9.1   < > 8.2* 8.2* 8.1* 8.2* 8.1*  MG  --   --  1.2*  --  1.9 2.2 1.8  AST 46*  --  70*  --  138* 103* 90*  ALT 33  --  32  --  50* 53* 52*  ALKPHOS 114  --  84  --  74 82 75  BILITOT 0.7  --  1.0  --  0.8 0.6 0.8   < > = values in this interval not displayed.   ------------------------------------------------------------------------------------------------------------------ No results for input(s): CHOL, HDL, LDLCALC, TRIG, CHOLHDL, LDLDIRECT in the last 72 hours.  Lab Results  Component Value Date   HGBA1C 8.7 (H) 03/06/2020   ------------------------------------------------------------------------------------------------------------------ No results for input(s): TSH, T4TOTAL, T3FREE, THYROIDAB in the last 72 hours.  Invalid input(s): FREET3 ------------------------------------------------------------------------------------------------------------------ No results for input(s): VITAMINB12, FOLATE, FERRITIN, TIBC, IRON, RETICCTPCT in the last 72 hours.  Coagulation profile Recent Labs  Lab 03/06/20 0358  INR 1.1    Recent Labs    03/09/20 0433 03/10/20 0500  DDIMER 2.52* 1.97*    Cardiac Enzymes No results for input(s): CKMB, TROPONINI, MYOGLOBIN in the last 168 hours.  Invalid input(s):  CK ------------------------------------------------------------------------------------------------------------------    Component Value Date/Time   BNP 87.3 03/06/2020 0820    Micro Results Recent Results (from the past 240 hour(s))  SARS Coronavirus 2 by RT PCR (hospital order, performed in [facility] hospital lab) Nasopharyngeal Nasopharyngeal Swab     Status: Abnormal   Collection Time:  03/06/20  3:58 AM   Specimen: Nasopharyngeal Swab  Result Value Ref Range Status   SARS Coronavirus 2 POSITIVE (A) NEGATIVE Final    Comment: RESULT CALLED TO, READ BACK BY AND VERIFIED WITH: K GIBSON RN 03/06/20 0538 JDW (NOTE) SARS-CoV-2 target nucleic acids are DETECTED  SARS-CoV-2 RNA is generally detectable in upper respiratory specimens  during the acute phase of infection.  Positive results are indicative  of the presence of the identified virus, but do not rule out bacterial infection or co-infection with other pathogens not detected by the test.  Clinical correlation with patient history and  other diagnostic information is necessary to determine patient infection status.  The expected result is negative.  Fact Sheet for Patients:   StrictlyIdeas.no   Fact Sheet for Healthcare Providers:   BankingDealers.co.za    This test is not yet approved or cleared by the [place] FDA and  has been authorized for detection and/or diagnosis of SARS-CoV-2 by FDA under an Emergency Use Authorization (EUA).  This EUA will remain in effect (meaning this test can  be used) for the duration of  the COVID-19 declaration under Section 564(b)(1) of the Act, 21 U.S.C. section 360-bbb-3(b)(1), unless the authorization is terminated or revoked sooner.  Performed at [facility], [place] [address] [place], [place] [address]   Blood Culture (routine x 2)     Status: None (Preliminary result)   Collection Time: 03/06/20  3:58 AM   Specimen: BLOOD  RIGHT WRIST  Result Value Ref Range Status   Specimen Description BLOOD RIGHT WRIST  Final   Special Requests   Final    BOTTLES DRAWN AEROBIC AND ANAEROBIC Blood Culture results may not be optimal due to an inadequate volume of blood received in culture bottles   Culture   Final    NO GROWTH 4 DAYS Performed at [facility], [place] [address] [place], [place] [address]    Report Status PENDING  Incomplete  Urine culture     Status: None   Collection Time: 03/06/20  3:58 AM   Specimen: In/Out Cath Urine  Result Value Ref Range Status   Specimen Description IN/OUT CATH URINE  Final   Special Requests NONE  Final   Culture   Final    NO GROWTH Performed at [facility], [place] [address], [place], [place] [address]    Report Status 03/07/2020 FINAL  Final  Blood Culture (routine x 2)     Status: None (Preliminary result)   Collection Time: 03/06/20  4:20 AM   Specimen: BLOOD RIGHT HAND  Result Value Ref Range Status   Specimen Description BLOOD RIGHT HAND  Final   Special Requests   Final    BOTTLES DRAWN AEROBIC AND ANAEROBIC Blood Culture results may not be optimal due to an inadequate volume of blood received in culture bottles   Culture   Final    NO GROWTH 4 DAYS Performed at [facility], [place] [address], [place], [place] [address]    Report Status PENDING  Incomplete    Radiology Reports DG Chest Port 1 View  Result Date: 03/06/2020 CLINICAL DATA:  Questionable sepsis EXAM: PORTABLE CHEST 1 VIEW COMPARISON:  10/30/2006 FINDINGS: Infiltrate at the left lung base. Accentuation of markings at the right base which may be atelectasis in the setting of low lung volumes. Cardiomegaly which is stable. No pulmonary edema, effusion, or pneumothorax. IMPRESSION: Low volume chest with infiltrate at the left base.  Electronically Signed   By: Monte Fantasia M.D.   On: 03/06/2020 04:13   DG Chest Port 1V same Day  Result Date: 03/07/2020 CLINICAL DATA:  Weakness. EXAM:  PORTABLE CHEST 1 VIEW COMPARISON:  March 06, 2020. FINDINGS: Stable cardiomediastinal silhouette. Patchy airspace opacities are noted bilaterally consistent with multifocal pneumonia. No pneumothorax pleural effusion is noted. Bony thorax is unremarkable. IMPRESSION: Bilateral multifocal pneumonia. Electronically Signed   By: Marijo Conception M.D.   On: 03/07/2020 09:24   VAS [place] LOWER EXTREMITY VENOUS (DVT)  Result Date: 03/08/2020  Lower Venous DVT Study Indications: Covid-19, elevated D-Dimer.  Limitations: Patient with altered mental status and constant movement. Comparison Study: No prior study on file Performing Technologist: Sharion Dove RVS  Examination Guidelines: A complete evaluation includes B-mode imaging, spectral Doppler, color Doppler, and power Doppler as needed of all accessible portions of each vessel. Bilateral testing is considered an integral part of a complete examination. Limited examinations for reoccurring indications may be performed as noted. The reflux portion of the exam is performed with the patient in reverse Trendelenburg.  +---------+---------------+---------+-----------+----------+-------------------+ RIGHT    CompressibilityPhasicitySpontaneityPropertiesThrombus Aging      +---------+---------------+---------+-----------+----------+-------------------+ CFV      Full           Yes      Yes                                      +---------+---------------+---------+-----------+----------+-------------------+ SFJ      Full                                                             +---------+---------------+---------+-----------+----------+-------------------+ FV Prox  Full                                                             +---------+---------------+---------+-----------+----------+-------------------+ FV Mid   Full                                                              +---------+---------------+---------+-----------+----------+-------------------+ FV DistalFull                                                             +---------+---------------+---------+-----------+----------+-------------------+ PFV      Full                                                             +---------+---------------+---------+-----------+----------+-------------------+ POP  Full           Yes      Yes                                      +---------+---------------+---------+-----------+----------+-------------------+ PTV      Full                                                             +---------+---------------+---------+-----------+----------+-------------------+ PERO                                                  Not well visualized +---------+---------------+---------+-----------+----------+-------------------+   +---------+---------------+---------+-----------+----------+-------------------+ LEFT     CompressibilityPhasicitySpontaneityPropertiesThrombus Aging      +---------+---------------+---------+-----------+----------+-------------------+ CFV      Full           Yes      Yes                                      +---------+---------------+---------+-----------+----------+-------------------+ SFJ      Full                                                             +---------+---------------+---------+-----------+----------+-------------------+ FV Prox  Full           Yes      Yes                                      +---------+---------------+---------+-----------+----------+-------------------+ FV Mid   Full                                                             +---------+---------------+---------+-----------+----------+-------------------+ FV DistalFull                                                             +---------+---------------+---------+-----------+----------+-------------------+  PFV      Full                                                             +---------+---------------+---------+-----------+----------+-------------------+ POP      Full           Yes  Yes                                      +---------+---------------+---------+-----------+----------+-------------------+ PTV      Full                                                             +---------+---------------+---------+-----------+----------+-------------------+ PERO                                                  Not well visualized +---------+---------------+---------+-----------+----------+-------------------+    Summary: RIGHT: - There is no evidence of deep vein thrombosis in the lower extremity. However, portions of this examination were limited- see technologist comments above.  LEFT: - There is no evidence of deep vein thrombosis in the lower extremity. However, portions of this examination were limited- see technologist comments above.  *See table(s) above for measurements and observations. Electronically signed by Monica Martinez MD on 03/08/2020 at 2:27:10 PM.    Final    [place] EKG SITE RITE  Result Date: 03/08/2020 If Site Rite image not attached, placement could not be confirmed due to current cardiac rhythm.

## 2020-03-10 NOTE — Progress Notes (Signed)
Late entry for 2000: Received PIV consult. Reason for consult discussed with RN. Pt removed PICC. PIV placed.

## 2020-03-10 NOTE — Progress Notes (Signed)
Inpatient Diabetes Program Recommendations  AACE/ADA: New Consensus Statement on Inpatient Glycemic Control (2015)  Target Ranges:  Prepandial:   less than 140 mg/dL      Peak postprandial:   less than 180 mg/dL (1-2 hours)      Critically ill patients:  140 - 180 mg/dL   Lab Results  Component Value Date   GLUCAP 83 03/10/2020   HGBA1C 8.7 (H) 03/06/2020    Review of Glycemic Control Results for [name], Joe [name] (MRN [id number]) as of 03/10/2020 09:45  Ref. Range 03/09/2020 12:13 03/09/2020 17:18 03/09/2020 21:01 03/10/2020 08:20  Glucose-Capillary Latest Ref Range: 70 - 99 mg/dL 187 (H) 226 (H) 200 (H) 83   Diabetes history: Type 2 DM Outpatient Diabetes medications: Glipizide 5 mg QD, Metformin 1000 mg BID Current orders for Inpatient glycemic control: Levemir 8 units BID, Novolog 0-15 units TID Prednisone 30 mg QAM  Inpatient Diabetes Program Recommendations:    With steroids being tapered, may want to consider decreasing Levemir to 5 units BID.   Thanks, Bronson Curb, MSN, RNC-OB Diabetes Coordinator [phone] (8a-5p)

## 2020-03-10 NOTE — TOC Initial Note (Signed)
Transition of Care [facility]) - Initial/Assessment Note    Patient Details  Name: Joe [name] MRN: [id number] Date of Birth: 10-20-1926  Transition of Care [facility]) CM/SW Contact:    Joe [name], [employer] Phone Number: 03/10/2020, 4:14 PM  Clinical Narrative:        CSW received consult for possible SNF placement at time of discharge. CSW spoke with patient's daughter and she confirmed that patient lives with her and states she is unable to care for patient. Daughter expressed understanding of PT recommendation and is agreeable to SNF placement at time of discharge. Patient reports preference for [facility] however CSW made her aware that they are not accepting COVID patients and only [facility] in [place] are accepting COVID patients. Patients daughter reported preferring placement in [place] but understood about availability. CSW discussed insurance authorization process and provided Medicare SNF ratings list. Patient has not received the COVID vaccines. Patient expressed being hopeful for rehab and to feel better soon. No further questions reported at this time.            CSW will continue to follow up with patient and family regarding SNF placement.    Expected Discharge Plan: Skilled Nursing Facility Barriers to Discharge: SNF Covid,SNF Covid Recovering   Patient Goals and CMS Choice Patient states their goals for this hospitalization and ongoing recovery are:: to go to SNF CMS Medicare.gov Compare Post Acute Care list provided to:: Other (Comment Required) (Patients daughter,  Joe [name]) Choice offered to / list presented to : Adult Children  Expected Discharge Plan and Services Expected Discharge Plan: [place] In-house Referral: NA Discharge Planning Services: NA Post Acute Care Choice: [place] Living arrangements for the past 2 months: Single Family Home                 DME Arranged: N/A DME Agency: NA       HH Arranged: NA HH  Agency: NA        Prior Living Arrangements/Services Living arrangements for the past 2 months: Single Family Home Lives with:: Adult Children Patient language and need for interpreter reviewed:: No Do you feel safe going back to the place where you live?: Yes      Need for Family Participation in Patient Care: Yes (Comment) Care giver support system in place?: Yes (comment)   Criminal Activity/Legal Involvement Pertinent to Current Situation/Hospitalization: No - Comment as needed  Activities of Daily Living      Permission Sought/Granted Permission sought to share information with : Family Supports Permission granted to share information with : Yes, Verbal Permission Granted  Share Information with NAME: Patients daughter Joe [name] [phone]  Permission granted to share info w AGENCY: SNF's  Permission granted to share info w Relationship: Patients daughter  Permission granted to share info w Contact Information: Patients daughter Joe [name] [phone]  Emotional Assessment Appearance::  (COVID positive) Attitude/Demeanor/Rapport:  (COVID positive) Affect (typically observed):  (COVID positive) Orientation: : Oriented to Self (disoriented to place, time and situation) Alcohol / Substance Use: Not Applicable Psych Involvement: No (comment)  Admission diagnosis:  Septic shock ([place]) [A41.9, R65.21] Diabetic ketoacidosis with coma associated with type 2 diabetes mellitus ([place]) [E11.11] COVID-19 virus infection [U07.1] Pneumonia due to COVID-19 virus [U07.1, J12.82] Patient Active Problem List   Diagnosis Date Noted  . Pneumonia due to COVID-19 virus 03/06/2020  . Acute respiratory failure with hypoxemia ([place]) 03/06/2020  . Severe sepsis ([place]) 03/06/2020  . DKA, type 2 ([place])  03/06/2020  . AKI (acute kidney injury) ([place]) 03/06/2020  . DNR (do not resuscitate) 03/06/2020   PCP:  Patient, No Pcp Per Pharmacy:   [facility] [address] ([address]), [place] [address] [place] [address] Phone: [phone] Fax: [phone]     Social Determinants of Health (SDOH) Interventions    Readmission Risk Interventions No flowsheet data found.

## 2020-03-10 NOTE — Progress Notes (Signed)
Physical Therapy Treatment Patient Details Name: Joe [name] MRN: [id number] DOB: 1926-07-02 Today's Date: 03/10/2020    History of Present Illness Pt is a 87 y.o. male admitted 03/06/20 with confusion, weakness, and nausea/vomiting. Workup for acute metabolic encephalopathy, DKA, sepsis, acute hypoxic respiratory failure due to COVID-19 PNA. PMH includes HTN, DM2.   PT Comments    Pt progressing with mobility; noted he sat up in recliner with nursing earlier today. Pt received in bed and declined additional transfer to recliner. Pt very HOH but following some gestural commands appropriately; performed seated EOB activity and repeated sit<>stands with RW and modA. Pt remains limited by generalized weakness, decreased activity tolerance, poor balance and cognitive impairment; at high risk for falls. Continue to recommend SNF-level therapies to maximize functional mobility and decrease caregiver burden; pt's family in agreement.    Follow Up Recommendations  SNF;Supervision/Assistance - 24 hour     Equipment Recommendations   (defer)    Recommendations for Other Services       Precautions / Restrictions Precautions Precautions: Fall Restrictions Weight Bearing Restrictions: No    Mobility  Bed Mobility Overal bed mobility: Needs Assistance Bed Mobility: Supine to Sit;Sit to Sidelying     Supine to sit: Min assist;HOB elevated   Sit to sidelying: Min guard General bed mobility comments: Pt following gestural commands to sit EOB, minA for UE support to assist trunk elevation, increased time and effort; return to L-sidelying with min guard to prevent fall from EOB  Transfers Overall transfer level: Needs assistance Equipment used: Rolling walker (2 wheeled) Transfers: Sit to/from Stand Sit to Stand: Mod assist         General transfer comment: Performed 3x sit<>stands from EOB to RW - pt initiating well with first trial when RW placed in front of him and minA for trunk  elevation, quick to return to sitting and declined transfer to recliner; 2x more trials, pt requiring modA to intiate standing, assist trunk elevation and maintain standing balance; forward flexed posture with difficulty achieving fully upright posture  Ambulation/Gait                 Stairs             Wheelchair Mobility    Modified Rankin (Stroke Patients Only)       Balance Overall balance assessment: Needs assistance   Sitting balance-Leahy Scale: Fair Sitting balance - Comments: Prolonged sitting EOB with min guard for balance, pt weight shifting well in all directions but likely unable to accept significant challenge without LOB                                    Cognition Arousal/Alertness: Awake/alert Behavior During Therapy: WFL for tasks assessed/performed Overall Cognitive Status: Difficult to assess                                 General Comments: Pt not combative/agitated this session. Difficult to determine cognition due to significant HOH; pt following some gestural commands well related to mobility. Clearly disoriented to situation as pt talking about things happening not in the room (speech difficult to understand at times). When gestured to sit in chair, pt able to state, "I don't want to sit there because I want to be able to lay down when I want"      Exercises  General Comments General comments (skin integrity, edema, etc.): SpO2 91% on RA      Pertinent Vitals/Pain Pain Assessment: Faces Faces Pain Scale: No hurt Pain Intervention(s): Monitored during session    Home Living                      Prior Function            PT Goals (current goals can now be found in the care plan section) Acute Rehab PT Goals Patient Stated Goal: Daughter hopeful for post-acute rehab at SNF (per SW note) PT Goal Formulation: With family Progress towards PT goals: Progressing toward goals     Frequency    Min 2X/week      PT Plan Current plan remains appropriate    Co-evaluation              AM-PAC PT "6 Clicks" Mobility   Outcome Measure  Help needed turning from your back to your side while in a flat bed without using bedrails?: A Little Help needed moving from lying on your back to sitting on the side of a flat bed without using bedrails?: A Lot Help needed moving to and from a bed to a chair (including a wheelchair)?: A Lot Help needed standing up from a chair using your arms (e.g., wheelchair or bedside chair)?: A Lot Help needed to walk in hospital room?: A Lot Help needed climbing 3-5 steps with a railing? : Total 6 Click Score: 12    End of Session   Activity Tolerance: Patient tolerated treatment well Patient left: in bed;with call bell/phone within reach;with bed alarm set Nurse Communication: Mobility status PT Visit Diagnosis: Other abnormalities of gait and mobility (R26.89)     Time: 7124-5809 PT Time Calculation (min) (ACUTE ONLY): 21 min  Charges:  $Therapeutic Activity: 8-22 mins                     Mabeline Caras, PT, DPT Acute Rehabilitation Services  Pager [phone] Office [place] 03/10/2020, 5:15 PM

## 2020-03-10 NOTE — Progress Notes (Signed)
  Speech Language Pathology Treatment: Dysphagia  Patient Details Name: Joe [name] MRN: [id number] DOB: 08-03-1926 Today's Date: 03/10/2020 Time: 1478-2956 SLP Time Calculation (min) (ACUTE ONLY): 14 min  Assessment / Plan / Recommendation Clinical Impression  Pt continues to exhibit s/sx of suspected reduced airway protection with thin liquids. This included isolated coughing immediate after small sips of water by cup. No overt s/sx of aspiration noted with nectar thick or puree textures. Trialed small piece of regular consistency. Pt with difficulty masticating and oral residuals. Pt remains confused, hard of hearing. Current diet of nectar thick and dysphagia 1 (puree) consistencies remains most appropriate. Pt will benefit from future MBSS if symptoms continue to persist. SLP to continue to follow.    HPI HPI: Pt is a 87 y.o. male with medical history significant of hypertension, diabetes mellitus type II, and solitary kidney who presented with complaints of weakness with nausea and vomiting and was found to have found to have acute metabolic encephalopathy, acute hypoxic respiratory failure, DKA due to COVID-19 infection. CXR 2/5: Bilateral multifocal pneumonia.      SLP Plan  Continue with current plan of care       Recommendations  Diet recommendations: Dysphagia 1 (puree);Nectar-thick liquid Liquids provided via: Cup Medication Administration: Crushed with puree Supervision: Staff to assist with self feeding;Full supervision/cueing for compensatory strategies Compensations: Slow rate;Small sips/bites;Follow solids with liquid Postural Changes and/or Swallow Maneuvers: Seated upright 90 degrees;Upright 30-60 min after meal                Oral Care Recommendations: Oral care BID Follow up Recommendations: Skilled Nursing facility;24 hour supervision/assistance SLP Visit Diagnosis: Dysphagia, unspecified (R13.10);Dysphagia, oral phase (R13.11) Plan: Continue with current  plan of care       [place], CCC-SLP Acute Rehabilitation Services   03/10/2020, 12:00 PM

## 2020-03-10 NOTE — NC FL2 (Signed)
[place] LEVEL OF CARE SCREENING TOOL     IDENTIFICATION  Patient Name: Joe [name] Birthdate: [birth year] Sex: male Admission Date (Current Location): 03/06/2020  Circles Of Care and [place] Number:  Herbalist and Address:  [facility] [place], [place] [address], [place] [employer], [place] [address]      Provider Number: [id number]  Attending Physician Name and Address:  Jonetta Osgood, MD  Relative Name and Phone Number:  patients daughter, Joe [name] [phone]    Current Level of Care: Hospital Recommended Level of Care: [place] Prior Approval Number:    Date Approved/Denied:   PASRR Number: [id number]  Discharge Plan: SNF    Current Diagnoses: Patient Active Problem List   Diagnosis Date Noted  . Pneumonia due to COVID-19 virus 03/06/2020  . Acute respiratory failure with hypoxemia ([place]) 03/06/2020  . Severe sepsis ([place]) 03/06/2020  . DKA, type 2 ([place]) 03/06/2020  . AKI (acute kidney injury) ([place]) 03/06/2020  . DNR (do not resuscitate) 03/06/2020    Orientation RESPIRATION BLADDER Height & Weight     Self (disoriented to time, place, and situation)  O2 (3L) External catheter,Incontinent (Existing external catheter) Weight: 215 lb (97.5 kg) Height:  5\' 10"  (177.8 cm)  BEHAVIORAL SYMPTOMS/MOOD NEUROLOGICAL BOWEL NUTRITION STATUS      Incontinent Diet (see d/c summary)  AMBULATORY STATUS COMMUNICATION OF NEEDS Skin   Extensive Assist Verbally Other (Comment) (Ecchymosis)                       Personal Care Assistance Level of Assistance  Bathing,Feeding,Dressing Bathing Assistance: Maximum assistance Feeding assistance: Maximum assistance Dressing Assistance: Maximum assistance     Functional Limitations Info  Sight,Hearing,Speech Sight Info: Adequate Hearing Info: Impaired Speech Info: Impaired (gaerbled)    SPECIAL CARE FACTORS FREQUENCY  PT (By licensed PT),OT (By licensed OT)     PT  Frequency: 5X per week OT Frequency: 5X per week            Contractures Contractures Info: Not present    Additional Factors Info  Code Status,Allergies,Insulin Sliding Scale,Isolation Precautions Code Status Info: DNR Allergies Info: NKA   Insulin Sliding Scale Info: see med list. Isolation Precautions Info: 03/06/20 COVID positive     Current Medications (03/10/2020):  This is the current hospital active medication list Current Facility-Administered Medications  Medication Dose Route Frequency Provider Last Rate Last Admin  . acetaminophen (TYLENOL) suppository 650 mg  650 mg Rectal Q6H PRN Fuller Plan A, MD      . acetaminophen (TYLENOL) tablet 650 mg  650 mg Oral Q6H PRN Fuller Plan A, MD   650 mg at 03/10/20 0258  . albuterol (VENTOLIN HFA) 108 (90 Base) MCG/ACT inhaler 2 puff  2 puff Inhalation Q6H Smith, Rondell A, MD   2 puff at 03/10/20 1434  . amLODipine (NORVASC) tablet 10 mg  10 mg Oral Daily Jonetta Osgood, MD   10 mg at 03/10/20 0924  . ascorbic acid (VITAMIN C) tablet 500 mg  500 mg Oral Daily Tamala Julian, Rondell A, MD   500 mg at 03/10/20 5277  . aspirin EC tablet 81 mg  81 mg Oral Daily Fuller Plan A, MD   81 mg at 03/10/20 8242  . cefTRIAXone (ROCEPHIN) 1 g in sodium chloride 0.9 % 100 mL IVPB  1 g Intravenous Q24H Jonetta Osgood, MD 200 mL/hr at 03/09/20 1732 1 g at 03/09/20 1732  . Chlorhexidine Gluconate Cloth  2 % PADS 6 each  6 each Topical Daily Jonetta Osgood, MD   6 each at 03/10/20 [phone]  . dextrose 50 % solution 0-50 mL  0-50 mL Intravenous PRN Cardama, Grayce Sessions, MD      . enoxaparin (LOVENOX) injection 40 mg  40 mg Subcutaneous Q24H Fuller Plan A, MD   40 mg at 03/10/20 0924  . famotidine (PEPCID) tablet 20 mg  20 mg Oral BID Ghimire, Henreitta Leber, MD      . guaiFENesin-dextromethorphan (ROBITUSSIN DM) 100-10 MG/5ML syrup 10 mL  10 mL Oral Q4H PRN Smith, Rondell A, MD      . hydrALAZINE (APRESOLINE) injection 10 mg  10 mg Intravenous Q6H  PRN Ghimire, Henreitta Leber, MD      . insulin aspart (novoLOG) injection 0-15 Units  0-15 Units Subcutaneous TID WC Fuller Plan A, MD   2 Units at 03/10/20 1139  . insulin detemir (LEVEMIR) injection 5 Units  5 Units Subcutaneous BID Ghimire, Henreitta Leber, MD      . melatonin tablet 3 mg  3 mg Oral QHS PRN Shela Leff, MD      . metoprolol succinate (TOPROL-XL) 24 hr tablet 75 mg  75 mg Oral Daily Jonetta Osgood, MD   75 mg at 03/10/20 3810  . ondansetron (ZOFRAN) tablet 4 mg  4 mg Oral Q6H PRN Fuller Plan A, MD       Or  . ondansetron (ZOFRAN) injection 4 mg  4 mg Intravenous Q6H PRN Smith, Rondell A, MD      . predniSONE (DELTASONE) tablet 30 mg  30 mg Oral Q breakfast Jonetta Osgood, MD   30 mg at 03/10/20 0923  . QUEtiapine (SEROQUEL) tablet 25 mg  25 mg Oral QHS Jonetta Osgood, MD   25 mg at 03/09/20 2244  . Resource ThickenUp Clear   Oral PRN Jonetta Osgood, MD      . sodium chloride flush (NS) 0.9 % injection 10-40 mL  10-40 mL Intracatheter PRN Ghimire, Henreitta Leber, MD      . sodium chloride flush (NS) 0.9 % injection 3 mL  3 mL Intravenous Q12H Smith, Rondell A, MD   3 mL at 03/10/20 1751  . zinc sulfate capsule 220 mg  220 mg Oral Daily Fuller Plan A, MD   220 mg at 03/10/20 0258     Discharge Medications: Please see discharge summary for a list of discharge medications.  Relevant Imaging Results:  Relevant Lab Results:   Additional Information SS#: [id number]  Joe [name], LCSWA

## 2020-03-11 DIAGNOSIS — E1111 Type 2 diabetes mellitus with ketoacidosis with coma: Secondary | ICD-10-CM | POA: Diagnosis not present

## 2020-03-11 DIAGNOSIS — N179 Acute kidney failure, unspecified: Secondary | ICD-10-CM | POA: Diagnosis not present

## 2020-03-11 DIAGNOSIS — J9601 Acute respiratory failure with hypoxia: Secondary | ICD-10-CM | POA: Diagnosis not present

## 2020-03-11 DIAGNOSIS — U071 COVID-19: Secondary | ICD-10-CM | POA: Diagnosis not present

## 2020-03-11 LAB — CBC WITH DIFFERENTIAL/PLATELET
Abs Immature Granulocytes: 0.05 10*3/uL (ref 0.00–0.07)
Basophils Absolute: 0 10*3/uL (ref 0.0–0.1)
Basophils Relative: 0 %
Eosinophils Absolute: 0.2 10*3/uL (ref 0.0–0.5)
Eosinophils Relative: 3 %
HCT: 39.1 % (ref 39.0–52.0)
Hemoglobin: 13.8 g/dL (ref 13.0–17.0)
Immature Granulocytes: 1 %
Lymphocytes Relative: 13 %
Lymphs Abs: 0.8 10*3/uL (ref 0.7–4.0)
MCH: 32.4 pg (ref 26.0–34.0)
MCHC: 35.3 g/dL (ref 30.0–36.0)
MCV: 91.8 fL (ref 80.0–100.0)
Monocytes Absolute: 1.1 10*3/uL — ABNORMAL HIGH (ref 0.1–1.0)
Monocytes Relative: 19 %
Neutro Abs: 3.8 10*3/uL (ref 1.7–7.7)
Neutrophils Relative %: 64 %
Platelets: 277 10*3/uL (ref 150–400)
RBC: 4.26 MIL/uL (ref 4.22–5.81)
RDW: 14.7 % (ref 11.5–15.5)
WBC: 5.8 10*3/uL (ref 4.0–10.5)
nRBC: 0 % (ref 0.0–0.2)

## 2020-03-11 LAB — COMPREHENSIVE METABOLIC PANEL
ALT: 54 U/L — ABNORMAL HIGH (ref 0–44)
AST: 132 U/L — ABNORMAL HIGH (ref 15–41)
Albumin: 2.6 g/dL — ABNORMAL LOW (ref 3.5–5.0)
Alkaline Phosphatase: 75 U/L (ref 38–126)
Anion gap: 12 (ref 5–15)
BUN: 35 mg/dL — ABNORMAL HIGH (ref 8–23)
CO2: 21 mmol/L — ABNORMAL LOW (ref 22–32)
Calcium: 8.3 mg/dL — ABNORMAL LOW (ref 8.9–10.3)
Chloride: 110 mmol/L (ref 98–111)
Creatinine, Ser: 1.08 mg/dL (ref 0.61–1.24)
GFR, Estimated: >60 mL/min (ref >60)
Glucose, Bld: 95 mg/dL (ref 70–99)
Potassium: 5.8 mmol/L — ABNORMAL HIGH (ref 3.5–5.1)
Sodium: 143 mmol/L (ref 135–145)
Total Bilirubin: 2 mg/dL — ABNORMAL HIGH (ref 0.3–1.2)
Total Protein: 4.9 g/dL — ABNORMAL LOW (ref 6.5–8.1)

## 2020-03-11 LAB — CULTURE, BLOOD (ROUTINE X 2)
Culture: NO GROWTH
Culture: NO GROWTH

## 2020-03-11 LAB — GLUCOSE, CAPILLARY
Glucose-Capillary: 85 mg/dL (ref 70–99)
Glucose-Capillary: 123 mg/dL — ABNORMAL HIGH (ref 70–99)
Glucose-Capillary: 102 mg/dL — ABNORMAL HIGH (ref 70–99)
Glucose-Capillary: 106 mg/dL — ABNORMAL HIGH (ref 70–99)

## 2020-03-11 LAB — D-DIMER, QUANTITATIVE: D-Dimer, Quant: 3.41 ug/mL-FEU — ABNORMAL HIGH (ref 0.00–0.50)

## 2020-03-11 LAB — C-REACTIVE PROTEIN: CRP: 1.4 mg/dL — ABNORMAL HIGH (ref <1.0)

## 2020-03-11 LAB — MAGNESIUM: Magnesium: 2 mg/dL (ref 1.7–2.4)

## 2020-03-11 MED ORDER — QUETIAPINE FUMARATE 25 MG PO TABS
12.5000 mg | ORAL_TABLET | Freq: Every day | ORAL | Status: DC
  Administered 2020-03-11: 12.5 mg via ORAL
  Filled 2020-03-11: qty 1

## 2020-03-11 MED ORDER — LACTATED RINGERS IV SOLN: INTRAVENOUS | Status: AC

## 2020-03-11 MED ORDER — PREDNISONE 20 MG PO TABS
20.0000 mg | ORAL_TABLET | Freq: Every day | ORAL | Status: DC
  Administered 2020-03-11: 20 mg via ORAL
  Filled 2020-03-11: qty 1

## 2020-03-11 MED ORDER — SODIUM ZIRCONIUM CYCLOSILICATE 10 G PO PACK
10.0000 g | PACK | Freq: Two times a day (BID) | ORAL | Status: DC
  Administered 2020-03-11 (×2): 10 g via ORAL
  Filled 2020-03-11 (×3): qty 1

## 2020-03-11 MED ORDER — HALOPERIDOL LACTATE 5 MG/ML IJ SOLN: 1.0000 mg | Freq: Four times a day (QID) | INTRAMUSCULAR | Status: DC | PRN

## 2020-03-11 NOTE — Progress Notes (Signed)
PROGRESS NOTE                                                                                                                                                                                                             Patient Demographics:    Joe [name], is a 87 y.o. male, DOB - 1926-05-04, WTU:[id number]  Outpatient Primary MD for the patient is Patient, No Pcp Per   Admit date - 03/06/2020   LOS - 5  Chief Complaint  Patient presents with  . Emesis       Brief Narrative: Patient is a 87 y.o. male with PMHx of HTN, DM-2, solitary kidney-presenting with confusion/nausea and vomiting-found to have acute metabolic encephalopathy, acute hypoxic respiratory failure, DKA due to COVID-19 infection. See below for further details.   COVID-19 vaccinated status:   Significant Events: 2/5>> Admit to [facility] for sepsis/hypoxia (on 6 L of HFNC)-due to COVID-19, AKI and DKA  Significant studies: 2/4>> chest x-ray: Left base infiltrate 2/5>>Chest x-ray: Bilateral multifocal pneumonia 2/5>> bilateral lower extremity Doppler: No DVT.  COVID-19 medications: Steroids: 2/4>> Remdesivir: 2/4>> 2/8 Actemra: X1 on 2/4  Antibiotics: Zithromax: 2/4>>2/6 Rocephin: 2/4>> 2/8 Cefepime: 2/4 x 1 Flagyl: 2/4 x 1 Vancomycin: 2/4 x 1  Microbiology data: 2/4 >>blood culture: No growth 2/4>> urine culture: No growth  Procedures: None  Consults: None  DVT prophylaxis: enoxaparin (LOVENOX) injection 40 mg Start: 03/06/20 0900    Subjective:   Confused-less awake compared to yesterday.  Not much oral intake so far compared to yesterday.   Assessment  & Plan :   Acute Hypoxic Resp Failure due to Covid 19 Viral pneumonia +/-bacterial pneumonia: Remains stable on around 2-3 L of oxygen.  Has completed a course of antibiotics and Remdesivir-steroid being gradually tapered down.  Remains at risk for aspiration given confusion and  elderly/frailty status.    Fever: Afebrile. O2 requirements:  SpO2: 90 % O2 Flow Rate (L/min): 2 L/min   COVID-19 Labs: Recent Labs    03/09/20 0433 03/10/20 0500 03/11/20 0547  DDIMER 2.52* 1.97* 3.41*  CRP 5.3* 2.6* 1.4*       Component Value Date/Time   BNP 87.3 03/06/2020 0820    Recent Labs  Lab 03/06/20 0820 03/07/20 0653 03/08/20 0203 03/09/20 0433  PROCALCITON 4.05 5.74 3.75 2.15    Lab Results  Component  Value Date   SARSCOV2NAA POSITIVE (A) 03/06/2020     Prone/Incentive Spirometry: Encouraged incentive spirometry use-but confused-will not follow commands.  Elevated D-dimer: Likely due to COVID-19 related inflammation-lower extremity Dopplers negative-with improving hypoxemia-doubt further work-up is required.  On prophylactic Lovenox.  Sepsis due to COVID-19 pneumonia +/-bacterial pneumonia: Sepsis physiology has resolved-cultures negative so far-has completed a course of antimicrobial therapy  Acute metabolic encephalopathy: Multifactorial-from sepsis/hypoxemia/DKA/steroid use-has significant hearing impairment at baseline-probably has some amount of cognitive dysfunction as well.  Continues to have confusion-but seems to wax and wane in severity at times.  Decrease Seroquel to 12.5 nightly-have asked nursing staff to see if we can get a bedside sitter.  I strongly suspect that he will continue to be confused as long as he remains hospitalized.   DKA: Overall improved-transitioned off insulin infusion-on SQ insulin  Acute kidney injury: Likely hemodynamically mediated-has resolved.  Has congenital solitary kidney.     Hypomagnesemia: Repleted  HTN: BP stable-continue metoprolol and amlodipine.  Reassess in the next 1-2 days for further adjustment.    Insulin-dependent DM-2 (A1c 8.7 on 2/4): CBGs stable-poor oral intake-at risk for hypoglycemia-stop Levemir-we will continue with SSI and follow.  If oral intake improves-suspect we can escalate insulin  regimen.     Recent Labs    03/10/20 2019 03/11/20 0727 03/11/20 1139  GLUCAP 157* 85 123*   Deconditioning/debility failure to thrive syndrome: Elderly-frail at baseline-now with acute hypoxia due to COVID/DKA-and has resultant severe debility/deconditioning and now failing to thrive with very poor oral intake.  His granddaughter came to visit him today-Per nursing staff-he did not eat much when she tried to feed him.  Continue supportive care-watch closely-if he continues to develop severe failure to thrive symptoms-poor oral intake-May need to talk to family about goals of care-and about transitioning to full comfort measures.  Hard of hearing  Obesity: Estimated body mass index is 30.85 kg/m as calculated from the following:   Height as of this encounter: 5\' 10"  (1.778 m).   Weight as of this encounter: 97.5 kg.   GI prophylaxis: H2 Blocker  ABG:    Component Value Date/Time   HCO3 16.6 (L) 03/06/2020 0659   TCO2 18 (L) 03/06/2020 0659   ACIDBASEDEF 9.0 (H) 03/06/2020 0659   O2SAT 100.0 03/06/2020 0659    Vent Settings: N/A    Condition - Extremely Guarded  Family Communication  :  Daughter-[phone] on 2/9  Code Status :  DNR  Diet :  Diet Order            DIET - DYS 1 Room service appropriate? Yes with Assist; Fluid consistency: Nectar Thick  Diet effective now                  Disposition Plan  :   Status is: Inpatient Remains inpatient appropriate because:Inpatient level of care appropriate due to severity of illness   Dispo: The patient is from: Home              Anticipated d/c is to: TBD              Anticipated d/c date is: > 3 days              Patient currently is not medically stable to d/c.   Difficult to place patient No   Barriers to discharge: Hypoxia requiring O2 supplementation/complete 5 days of IV Remdesivir  Antimicorbials  :    Anti-infectives (From admission, onward)   Start  Dose/Rate Route Frequency Ordered Stop    03/07/20 1000  remdesivir 100 mg in sodium chloride 0.9 % 100 mL IVPB       "Followed by" Linked Group Details   100 mg 200 mL/hr over 30 Minutes Intravenous Daily 03/06/20 0555 03/11/20 0804   03/06/20 1600  cefTRIAXone (ROCEPHIN) 1 g in sodium chloride 0.9 % 100 mL IVPB        1 g 200 mL/hr over 30 Minutes Intravenous Every 24 hours 03/06/20 0810 03/10/20 2135   03/06/20 1400  azithromycin (ZITHROMAX) 500 mg in sodium chloride 0.9 % 250 mL IVPB  Status:  Discontinued        500 mg 250 mL/hr over 60 Minutes Intravenous Every 24 hours 03/06/20 0810 03/06/20 0837   03/06/20 0900  azithromycin (ZITHROMAX) 500 mg in sodium chloride 0.9 % 250 mL IVPB  Status:  Discontinued        500 mg 250 mL/hr over 60 Minutes Intravenous Every 24 hours 03/06/20 0837 03/09/20 1052   03/06/20 0700  remdesivir 200 mg in sodium chloride 0.9% 250 mL IVPB       "Followed by" Linked Group Details   200 mg 580 mL/hr over 30 Minutes Intravenous Once 03/06/20 0555 03/06/20 0835   03/06/20 0430  vancomycin (VANCOREADY) IVPB 2000 mg/400 mL        2,000 mg 200 mL/hr over 120 Minutes Intravenous  Once 03/06/20 0423 03/06/20 0713   03/06/20 0400  ceFEPIme (MAXIPIME) 2 g in sodium chloride 0.9 % 100 mL IVPB        2 g 200 mL/hr over 30 Minutes Intravenous  Once 03/06/20 0358 03/06/20 0452   03/06/20 0400  metroNIDAZOLE (FLAGYL) IVPB 500 mg        500 mg 100 mL/hr over 60 Minutes Intravenous  Once 03/06/20 0358 03/06/20 0622   03/06/20 0400  vancomycin (VANCOCIN) IVPB 1000 mg/200 mL premix  Status:  Discontinued        1,000 mg 200 mL/hr over 60 Minutes Intravenous  Once 03/06/20 0358 03/06/20 0423      Inpatient Medications  Scheduled Meds: . albuterol  2 puff Inhalation Q6H  . amLODipine  10 mg Oral Daily  . vitamin C  500 mg Oral Daily  . aspirin EC  81 mg Oral Daily  . Chlorhexidine Gluconate Cloth  6 each Topical Daily  . enoxaparin (LOVENOX) injection  40 mg Subcutaneous Q24H  . famotidine  20 mg Oral  BID  . insulin aspart  0-15 Units Subcutaneous TID WC  . insulin detemir  5 Units Subcutaneous BID  . metoprolol succinate  75 mg Oral Daily  . predniSONE  20 mg Oral Q breakfast  . QUEtiapine  12.5 mg Oral QHS  . sodium chloride flush  3 mL Intravenous Q12H  . sodium zirconium cyclosilicate  10 g Oral BID  . zinc sulfate  220 mg Oral Daily   Continuous Infusions:  PRN Meds:.acetaminophen, acetaminophen, dextrose, guaiFENesin-dextromethorphan, hydrALAZINE, melatonin, ondansetron **OR** ondansetron (ZOFRAN) IV, Resource ThickenUp Clear, sodium chloride flush   Time Spent in minutes  25    See all Orders from today for further details   Oren Binet M.D on 03/11/2020 at 2:40 PM  To page go to [email or web address] - use universal password  [facility] -  Office  [phone]    Objective:   Vitals:   03/10/20 1343 03/10/20 2100 03/11/20 0425 03/11/20 1413  BP: (!) 151/62 (!) 168/83 (!) 154/86 136/72  Pulse: 92 93 100 96  Resp: 19 18 20 18   Temp: 98.1 F (36.7 C) 98.1 F (36.7 C) 98.3 F (36.8 C) 98.6 F (37 C)  TempSrc: Axillary Oral Axillary Axillary  SpO2: 91% 90% 91% 90%  Weight:      Height:        Wt Readings from Last 3 Encounters:  03/06/20 97.5 kg     Intake/Output Summary (Last 24 hours) at 03/11/2020 1440 Last data filed at 03/11/2020 0900 Gross per 24 hour  Intake 203 ml  Output --  Net 203 ml     Physical Exam Gen Exam:Alert awake-not in any distress HEENT:atraumatic, normocephalic Chest: B/L clear to auscultation anteriorly CVS:S1S2 regular Abdomen:soft non tender, non distended Extremities:no edema Neurology: Non focal Skin: no rash   Data Review:    CBC Recent Labs  Lab 03/07/20 0205 03/08/20 0203 03/09/20 0433 03/10/20 0500 03/11/20 1005  WBC 13.5* 8.9 9.8 4.6 5.8  HGB 13.2 13.9 14.3 13.4 13.8  HCT 38.1* 41.4 42.1 38.9* 39.1  PLT 283 284 305 265 277  MCV 92.0 92.2 92.9 92.4 91.8  MCH 31.9 31.0 31.6 31.8 32.4  MCHC 34.6  33.6 34.0 34.4 35.3  RDW 14.5 14.6 14.8 14.7 14.7  LYMPHSABS 0.4* 0.3* 0.6* 0.6* 0.8  MONOABS 0.5 0.3 0.4 0.8 1.1*  EOSABS 0.0 0.0 0.0 0.0 0.2  BASOSABS 0.0 0.0 0.0 0.0 0.0    Chemistries  Recent Labs  Lab 03/07/20 0205 03/07/20 0653 03/08/20 0203 03/09/20 0433 03/10/20 0500 03/11/20 0547  NA 140 140 139 142 143 143  K 4.3 4.8 4.4 4.3 3.7 5.8*  CL 109 108 107 108 107 110  CO2 14* 17* 16* 21* 22 21*  GLUCOSE 189* 218* 215* 196* 96 95  BUN 26* 31* 36* 40* 41* 35*  CREATININE 1.54* 1.55* 1.36* 1.30* 1.10 1.08  CALCIUM 8.2* 8.2* 8.1* 8.2* 8.1* 8.3*  MG 1.2*  --  1.9 2.2 1.8 2.0  AST 70*  --  138* 103* 90* 132*  ALT 32  --  50* 53* 52* 54*  ALKPHOS 84  --  74 82 75 75  BILITOT 1.0  --  0.8 0.6 0.8 2.0*   ------------------------------------------------------------------------------------------------------------------ No results for input(s): CHOL, HDL, LDLCALC, TRIG, CHOLHDL, LDLDIRECT in the last 72 hours.  Lab Results  Component Value Date   HGBA1C 8.7 (H) 03/06/2020   ------------------------------------------------------------------------------------------------------------------ No results for input(s): TSH, T4TOTAL, T3FREE, THYROIDAB in the last 72 hours.  Invalid input(s): FREET3 ------------------------------------------------------------------------------------------------------------------ No results for input(s): VITAMINB12, FOLATE, FERRITIN, TIBC, IRON, RETICCTPCT in the last 72 hours.  Coagulation profile Recent Labs  Lab 03/06/20 0358  INR 1.1    Recent Labs    03/10/20 0500 03/11/20 0547  DDIMER 1.97* 3.41*    Cardiac Enzymes No results for input(s): CKMB, TROPONINI, MYOGLOBIN in the last 168 hours.  Invalid input(s): CK ------------------------------------------------------------------------------------------------------------------    Component Value Date/Time   BNP 87.3 03/06/2020 0820    Micro Results Recent Results (from the past  240 hour(s))  SARS Coronavirus 2 by RT PCR (hospital order, performed in [facility] hospital lab) Nasopharyngeal Nasopharyngeal Swab     Status: Abnormal   Collection Time: 03/06/20  3:58 AM   Specimen: Nasopharyngeal Swab  Result Value Ref Range Status   SARS Coronavirus 2 POSITIVE (A) NEGATIVE Final    Comment: RESULT CALLED TO, READ BACK BY AND VERIFIED WITH: K GIBSON RN 03/06/20 0538 JDW (NOTE) SARS-CoV-2 target nucleic acids are DETECTED  SARS-CoV-2 RNA is generally detectable in upper respiratory specimens  during the acute phase of infection.  Positive results are indicative  of the presence of the identified virus, but do not rule out bacterial infection or co-infection with other pathogens not detected by the test.  Clinical correlation with patient history and  other diagnostic information is necessary to determine patient infection status.  The expected result is negative.  Fact Sheet for Patients:   StrictlyIdeas.no   Fact Sheet for Healthcare Providers:   BankingDealers.co.za    This test is not yet approved or cleared by the [place] FDA and  has been authorized for detection and/or diagnosis of SARS-CoV-2 by FDA under an Emergency Use Authorization (EUA).  This EUA will remain in effect (meaning this test can  be used) for the duration of  the COVID-19 declaration under Section 564(b)(1) of the Act, 21 U.S.C. section 360-bbb-3(b)(1), unless the authorization is terminated or revoked sooner.  Performed at [facility], [place] [address] [place], [place] [address]   Blood Culture (routine x 2)     Status: None   Collection Time: 03/06/20  3:58 AM   Specimen: BLOOD RIGHT WRIST  Result Value Ref Range Status   Specimen Description BLOOD RIGHT WRIST  Final   Special Requests   Final    BOTTLES DRAWN AEROBIC AND ANAEROBIC Blood Culture results may not be optimal due to an inadequate volume of blood received in  culture bottles   Culture   Final    NO GROWTH 5 DAYS Performed at [facility], [place] [address] [place], [place] [address]    Report Status 03/11/2020 FINAL  Final  Urine culture     Status: None   Collection Time: 03/06/20  3:58 AM   Specimen: In/Out Cath Urine  Result Value Ref Range Status   Specimen Description IN/OUT CATH URINE  Final   Special Requests NONE  Final   Culture   Final    NO GROWTH Performed at [facility], [place] [address], [place], [place] [address]    Report Status 03/07/2020 FINAL  Final  Blood Culture (routine x 2)     Status: None   Collection Time: 03/06/20  4:20 AM   Specimen: BLOOD RIGHT HAND  Result Value Ref Range Status   Specimen Description BLOOD RIGHT HAND  Final   Special Requests   Final    BOTTLES DRAWN AEROBIC AND ANAEROBIC Blood Culture results may not be optimal due to an inadequate volume of blood received in culture bottles   Culture   Final    NO GROWTH 5 DAYS Performed at [facility], [place] [address] [place], [place] [address]    Report Status 03/11/2020 FINAL  Final    Radiology Reports DG Chest Port 1 View  Result Date: 03/06/2020 CLINICAL DATA:  Questionable sepsis EXAM: PORTABLE CHEST 1 VIEW COMPARISON:  10/30/2006 FINDINGS: Infiltrate at the left lung base. Accentuation of markings at the right base which may be atelectasis in the setting of low lung volumes. Cardiomegaly which is stable. No pulmonary edema, effusion, or pneumothorax. IMPRESSION: Low volume chest with infiltrate at the left base. Electronically Signed   By: Monte Fantasia M.D.   On: 03/06/2020 04:13   DG Chest Port 1V same Day  Result Date: 03/07/2020 CLINICAL DATA:  Weakness. EXAM: PORTABLE CHEST 1 VIEW COMPARISON:  March 06, 2020. FINDINGS: Stable cardiomediastinal silhouette. Patchy airspace opacities are noted bilaterally consistent with multifocal pneumonia. No pneumothorax pleural effusion is noted. Bony thorax is  unremarkable.  IMPRESSION: Bilateral multifocal pneumonia. Electronically Signed   By: Marijo Conception M.D.   On: 03/07/2020 09:24   VAS [place] LOWER EXTREMITY VENOUS (DVT)  Result Date: 03/08/2020  Lower Venous DVT Study Indications: Covid-19, elevated D-Dimer.  Limitations: Patient with altered mental status and constant movement. Comparison Study: No prior study on file Performing Technologist: Sharion Dove RVS  Examination Guidelines: A complete evaluation includes B-mode imaging, spectral Doppler, color Doppler, and power Doppler as needed of all accessible portions of each vessel. Bilateral testing is considered an integral part of a complete examination. Limited examinations for reoccurring indications may be performed as noted. The reflux portion of the exam is performed with the patient in reverse Trendelenburg.  +---------+---------------+---------+-----------+----------+-------------------+ RIGHT    CompressibilityPhasicitySpontaneityPropertiesThrombus Aging      +---------+---------------+---------+-----------+----------+-------------------+ CFV      Full           Yes      Yes                                      +---------+---------------+---------+-----------+----------+-------------------+ SFJ      Full                                                             +---------+---------------+---------+-----------+----------+-------------------+ FV Prox  Full                                                             +---------+---------------+---------+-----------+----------+-------------------+ FV Mid   Full                                                             +---------+---------------+---------+-----------+----------+-------------------+ FV DistalFull                                                             +---------+---------------+---------+-----------+----------+-------------------+ PFV      Full                                                              +---------+---------------+---------+-----------+----------+-------------------+ POP      Full           Yes      Yes                                      +---------+---------------+---------+-----------+----------+-------------------+ PTV      Full                                                             +---------+---------------+---------+-----------+----------+-------------------+  PERO                                                  Not well visualized +---------+---------------+---------+-----------+----------+-------------------+   +---------+---------------+---------+-----------+----------+-------------------+ LEFT     CompressibilityPhasicitySpontaneityPropertiesThrombus Aging      +---------+---------------+---------+-----------+----------+-------------------+ CFV      Full           Yes      Yes                                      +---------+---------------+---------+-----------+----------+-------------------+ SFJ      Full                                                             +---------+---------------+---------+-----------+----------+-------------------+ FV Prox  Full           Yes      Yes                                      +---------+---------------+---------+-----------+----------+-------------------+ FV Mid   Full                                                             +---------+---------------+---------+-----------+----------+-------------------+ FV DistalFull                                                             +---------+---------------+---------+-----------+----------+-------------------+ PFV      Full                                                             +---------+---------------+---------+-----------+----------+-------------------+ POP      Full           Yes      Yes                                      +---------+---------------+---------+-----------+----------+-------------------+  PTV      Full                                                             +---------+---------------+---------+-----------+----------+-------------------+ PERO  Not well visualized +---------+---------------+---------+-----------+----------+-------------------+    Summary: RIGHT: - There is no evidence of deep vein thrombosis in the lower extremity. However, portions of this examination were limited- see technologist comments above.  LEFT: - There is no evidence of deep vein thrombosis in the lower extremity. However, portions of this examination were limited- see technologist comments above.  *See table(s) above for measurements and observations. Electronically signed by Monica Martinez MD on 03/08/2020 at 2:27:10 PM.    Final    [place] EKG SITE RITE  Result Date: 03/08/2020 If Site Rite image not attached, placement could not be confirmed due to current cardiac rhythm.

## 2020-03-12 DIAGNOSIS — N179 Acute kidney failure, unspecified: Secondary | ICD-10-CM | POA: Diagnosis not present

## 2020-03-12 DIAGNOSIS — J9601 Acute respiratory failure with hypoxia: Secondary | ICD-10-CM | POA: Diagnosis not present

## 2020-03-12 DIAGNOSIS — U071 COVID-19: Secondary | ICD-10-CM | POA: Diagnosis not present

## 2020-03-12 DIAGNOSIS — E1111 Type 2 diabetes mellitus with ketoacidosis with coma: Secondary | ICD-10-CM | POA: Diagnosis not present

## 2020-03-12 LAB — CBC WITH DIFFERENTIAL/PLATELET
Abs Immature Granulocytes: 0.06 10*3/uL (ref 0.00–0.07)
Basophils Absolute: 0 10*3/uL (ref 0.0–0.1)
Basophils Relative: 0 %
Eosinophils Absolute: 0.2 10*3/uL (ref 0.0–0.5)
Eosinophils Relative: 5 %
HCT: 38.4 % — ABNORMAL LOW (ref 39.0–52.0)
Hemoglobin: 13.1 g/dL (ref 13.0–17.0)
Immature Granulocytes: 1 %
Lymphocytes Relative: 15 %
Lymphs Abs: 0.8 10*3/uL (ref 0.7–4.0)
MCH: 31.8 pg (ref 26.0–34.0)
MCHC: 34.1 g/dL (ref 30.0–36.0)
MCV: 93.2 fL (ref 80.0–100.0)
Monocytes Absolute: 0.9 10*3/uL (ref 0.1–1.0)
Monocytes Relative: 17 %
Neutro Abs: 3.3 10*3/uL (ref 1.7–7.7)
Neutrophils Relative %: 62 %
Platelets: 247 10*3/uL (ref 150–400)
RBC: 4.12 MIL/uL — ABNORMAL LOW (ref 4.22–5.81)
RDW: 15.1 % (ref 11.5–15.5)
WBC: 5.3 10*3/uL (ref 4.0–10.5)
nRBC: 0 % (ref 0.0–0.2)

## 2020-03-12 LAB — COMPREHENSIVE METABOLIC PANEL
ALT: 63 U/L — ABNORMAL HIGH (ref 0–44)
AST: 123 U/L — ABNORMAL HIGH (ref 15–41)
Albumin: 2.5 g/dL — ABNORMAL LOW (ref 3.5–5.0)
Alkaline Phosphatase: 80 U/L (ref 38–126)
Anion gap: 12 (ref 5–15)
BUN: 32 mg/dL — ABNORMAL HIGH (ref 8–23)
CO2: 25 mmol/L (ref 22–32)
Calcium: 8.6 mg/dL — ABNORMAL LOW (ref 8.9–10.3)
Chloride: 110 mmol/L (ref 98–111)
Creatinine, Ser: 1.24 mg/dL (ref 0.61–1.24)
GFR, Estimated: 55 mL/min — ABNORMAL LOW (ref >60)
Glucose, Bld: 95 mg/dL (ref 70–99)
Potassium: 4.4 mmol/L (ref 3.5–5.1)
Sodium: 147 mmol/L — ABNORMAL HIGH (ref 135–145)
Total Bilirubin: 1 mg/dL (ref 0.3–1.2)
Total Protein: 4.6 g/dL — ABNORMAL LOW (ref 6.5–8.1)

## 2020-03-12 LAB — GLUCOSE, CAPILLARY
Glucose-Capillary: 113 mg/dL — ABNORMAL HIGH (ref 70–99)
Glucose-Capillary: 230 mg/dL — ABNORMAL HIGH (ref 70–99)
Glucose-Capillary: 174 mg/dL — ABNORMAL HIGH (ref 70–99)
Glucose-Capillary: 158 mg/dL — ABNORMAL HIGH (ref 70–99)

## 2020-03-12 LAB — MAGNESIUM: Magnesium: 1.9 mg/dL (ref 1.7–2.4)

## 2020-03-12 LAB — C-REACTIVE PROTEIN: CRP: 1 mg/dL — ABNORMAL HIGH (ref <1.0)

## 2020-03-12 LAB — D-DIMER, QUANTITATIVE: D-Dimer, Quant: 3.7 ug/mL-FEU — ABNORMAL HIGH (ref 0.00–0.50)

## 2020-03-12 MED ORDER — QUETIAPINE FUMARATE 25 MG PO TABS
25.0000 mg | ORAL_TABLET | Freq: Every day | ORAL | Status: DC
  Administered 2020-03-12 – 2020-03-15 (×4): 25 mg via ORAL
  Filled 2020-03-12 (×4): qty 1

## 2020-03-12 MED ORDER — SODIUM CHLORIDE 0.45 % IV SOLN: INTRAVENOUS | Status: AC

## 2020-03-12 MED ORDER — PREDNISONE 5 MG PO TABS
10.0000 mg | ORAL_TABLET | Freq: Every day | ORAL | Status: AC
  Administered 2020-03-12: 10 mg via ORAL
  Filled 2020-03-12: qty 2

## 2020-03-12 MED ORDER — SODIUM CHLORIDE 0.45 % IV SOLN: INTRAVENOUS | Status: DC

## 2020-03-12 NOTE — TOC Progression Note (Signed)
Transition of Care [facility]) - Progression Note    Patient Details  Name: Joe [name] MRN: [id number] Date of Birth: 06-21-1926  Transition of Care [facility]) CM/SW [place], [place] Work Phone Number: 03/12/2020, 10:04 AM  Clinical Narrative:    CSW intern provided bed offers to patients daughter [name]), patients daughter said she would call back later when she decided between [place] and [place]. She mentioned she would want whichever is closer to the [place] area.     Expected Discharge Plan: Skilled Nursing Facility Barriers to Discharge: SNF Covid,SNF Covid Recovering  Expected Discharge Plan and Services Expected Discharge Plan: [place] In-house Referral: NA Discharge Planning Services: NA Post Acute Care Choice: [place] Living arrangements for the past 2 months: Single Family Home                 DME Arranged: N/A DME Agency: NA       HH Arranged: NA HH Agency: NA         Social Determinants of Health (SDOH) Interventions    Readmission Risk Interventions No flowsheet data found.

## 2020-03-12 NOTE — Progress Notes (Signed)
  Speech Language Pathology Treatment: Dysphagia  Patient Details Name: Joe [name] MRN: [id number] DOB: [birth year] Today's Date: 03/12/2020 Time: 5809-9833 SLP Time Calculation (min) (ACUTE ONLY): 18 min  Assessment / Plan / Recommendation Clinical Impression  Pt was seen for dysphagia treatment. He was alert and cooperative during the session. Pt's daughter was contacted via phone and reported that pt has been symptomatic of dysphagia for some time and "gets choked easily" with solids and liquids. Pt's family has brought some of the pt's preferred snacks due to pt's poor p.o. intake. Trials of various snacks were provided. Pt demonstrated reduced bolus awareness and absent mastication of peanut butter crackers despite verbal prompts. Prolonged mastication was demonstrated with soft Little Debbie sandwich cookies. Mild lingual residue was noted, and improved with a liquid wash. Pt's tolerance of thin liquids appears improved with less consistent signs of aspiration with reduced bolus sizes of thin liquids via cup. He continues to be symptomatic with larger boluses of thin liquids, and tolerance of puree and nectar thick liquids is still functional. A dysphagia 2 diet will nectar thick liquids will be initiated. Pt's daughter reported that dysphagia 2 is more similar to his baseline diet and that she is comfortable with his continuing nectar thick liquids. SLP is cautiously hopeful that this will help improve p.o. intake, but his acceptance of his preferred foods was even limited during the session. SLP will continue to follow pt.    HPI HPI: Pt is a 87 y.o. male with medical history significant of hypertension, diabetes mellitus type II, and solitary kidney who presented with complaints of weakness with nausea and vomiting and was found to have found to have acute metabolic encephalopathy, acute hypoxic respiratory failure, DKA due to COVID-19 infection. CXR 2/5: Bilateral multifocal pneumonia.       SLP Plan  Continue with current plan of care       Recommendations  Diet recommendations: Dysphagia 2 (fine chop);Nectar-thick liquid Liquids provided via: Cup;No straw Medication Administration: Crushed with puree Supervision: Staff to assist with self feeding;Full supervision/cueing for compensatory strategies Compensations: Slow rate;Small sips/bites;Follow solids with liquid Postural Changes and/or Swallow Maneuvers: Seated upright 90 degrees;Upright 30-60 min after meal                Oral Care Recommendations: Oral care BID Follow up Recommendations: Skilled Nursing facility;24 hour supervision/assistance SLP Visit Diagnosis: Dysphagia, unspecified (R13.10);Dysphagia, oral phase (R13.11) Plan: Continue with current plan of care       Lanee Chain I. Hardin Negus, [place], [place] Office number [phone] Pager [place] 03/12/2020, 11:13 AM

## 2020-03-12 NOTE — Progress Notes (Addendum)
PROGRESS NOTE                                                                                                                                                                                                             Patient Demographics:    Joe [name], is a 87 y.o. male, DOB - 05/07/1926, CXK:[id number]  Outpatient Primary MD for the patient is Patient, No Pcp Per   Admit date - 03/06/2020   LOS - 6  Chief Complaint  Patient presents with  . Emesis       Brief Narrative: Patient is a 87 y.o. male with PMHx of HTN, DM-2, solitary kidney-presenting with confusion/nausea and vomiting-found to have acute metabolic encephalopathy, acute hypoxic respiratory failure, DKA due to COVID-19 infection. See below for further details.   COVID-19 vaccinated status:   Significant Events: 2/5>> Admit to [facility] for sepsis/hypoxia (on 6 L of HFNC)-due to COVID-19, AKI and DKA  Significant studies: 2/4>> chest x-ray: Left base infiltrate 2/5>>Chest x-ray: Bilateral multifocal pneumonia 2/5>> bilateral lower extremity Doppler: No DVT.  COVID-19 medications: Steroids: 2/4>> Remdesivir: 2/4>> 2/8 Actemra: X1 on 2/4  Antibiotics: Zithromax: 2/4>>2/6 Rocephin: 2/4>> 2/8 Cefepime: 2/4 x 1 Flagyl: 2/4 x 1 Vancomycin: 2/4 x 1  Microbiology data: 2/4 >>blood culture: No growth 2/4>> urine culture: No growth  Procedures: None  Consults: None  DVT prophylaxis: enoxaparin (LOVENOX) injection 40 mg Start: 03/06/20 0900    Subjective:   Lethargic-but awake-following simple commands-Per nursing staff-no significant oral intake for the past 2-3 days.   Assessment  & Plan :   Acute Hypoxic Resp Failure due to Covid 19 Viral pneumonia +/-bacterial pneumonia: Remains stable on 2-3 L of oxygen-no longer on antimicrobial therapy-steroids being tapered down.  Given encephalopathy/confusion-remains at significant risk for  aspiration.  Continue supportive care.    Fever: Afebrile. O2 requirements:  SpO2: 90 % O2 Flow Rate (L/min): 4 L/min   COVID-19 Labs: Recent Labs    03/10/20 0500 03/11/20 0547 03/12/20 0046  DDIMER 1.97* 3.41* 3.70*  CRP 2.6* 1.4* 1.0*       Component Value Date/Time   BNP 87.3 03/06/2020 0820    Recent Labs  Lab 03/06/20 0820 03/07/20 0653 03/08/20 0203 03/09/20 0433  PROCALCITON 4.05 5.74 3.75 2.15    Lab Results  Component Value Date   SARSCOV2NAA POSITIVE (A)  03/06/2020     Prone/Incentive Spirometry: Encouraged incentive spirometry use-but confused-will not follow commands.  Elevated D-dimer: Likely due to COVID-19 related inflammation-lower extremity Dopplers negative-with improving hypoxemia-doubt further work-up is required.  On prophylactic Lovenox.  Sepsis due to COVID-19 pneumonia +/-bacterial pneumonia: Sepsis physiology has resolved-cultures negative so far-has completed a course of antimicrobial therapy  Acute metabolic encephalopathy: Multifactorial-from sepsis/hypoxemia/DKA/steroid use-has significant hearing impairment at baseline-probably has some amount of cognitive dysfunction as well.  Continues to have confusion-but seems to wax and wane in severity at times.  He remains on Seroquel-suspect he will continue to have AMS/delirium as long as hospitalized.  DKA: Overall improved-transitioned off insulin infusion-on SQ insulin  Acute kidney injury: Likely hemodynamically mediated-has resolved.  Has congenital solitary kidney.     Hypomagnesemia: Repleted  HTN: BP stable-continue metoprolol and amlodipine.  Reassess in the next 1-2 days for further adjustment.    Insulin-dependent DM-2 (A1c 8.7 on 2/4): CBGs stable-poor oral intake-at risk for hypoglycemia-continue SSI.  Not a candidate for aggressive glycemic control.  Allow permissive hyperglycemia.     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  His granddaughter came to visit him today-Per nursing staff-he did not eat much when she tried to feed him.  Continue supportive care-watch closely-if he continues to develop severe failure to thrive symptoms-poor oral intake-May need to talk to family about goals of care-and about transitioning to full comfort measures.  Have consulted palliative care as well.  Hard of hearing  Obesity: Estimated body mass index is 30.85 kg/m as calculated from the following:   Height as of this encounter: 5\' 10"  (1.778 m).   Weight as of this encounter: 97.5 kg.   GI prophylaxis: H2 Blocker  ABG:    Component Value Date/Time   HCO3 16.6 (L) 03/06/2020 0659   TCO2 18 (L) 03/06/2020 0659   ACIDBASEDEF 9.0 (H) 03/06/2020 0659   O2SAT 100.0 03/06/2020 0659    Vent Settings: N/A    Condition - Extremely Guarded  Family Communication  :  Daughter-[phone] on 2/10  Code Status :  DNR  Diet :  Diet Order            DIET DYS 2 Room service appropriate? Yes with Assist; Fluid consistency: Nectar Thick  Diet effective now                  Disposition Plan  :   Status is: Inpatient Remains inpatient appropriate because:Inpatient level of care appropriate due to severity of illness   Dispo: The patient is from: Home              Anticipated d/c is to: TBD              Anticipated d/c date is: > 3 days              Patient currently is not medically stable to d/c.   Difficult to place patient No   Barriers to discharge: Hypoxia requiring O2 supplementation/complete 5 days of IV Remdesivir  Antimicorbials  :    Anti-infectives (From admission, onward)   Start     Dose/Rate Route Frequency Ordered Stop   03/07/20 1000  remdesivir 100 mg in sodium chloride 0.9  % 100 mL IVPB       "  Followed by" Linked Group Details   100 mg 200 mL/hr over 30 Minutes Intravenous Daily 03/06/20 0555 03/11/20 0804   03/06/20 1600  cefTRIAXone (ROCEPHIN) 1 g in sodium chloride 0.9 % 100 mL IVPB        1 g 200 mL/hr over 30 Minutes Intravenous Every 24 hours 03/06/20 0810 03/10/20 2135   03/06/20 1400  azithromycin (ZITHROMAX) 500 mg in sodium chloride 0.9 % 250 mL IVPB  Status:  Discontinued        500 mg 250 mL/hr over 60 Minutes Intravenous Every 24 hours 03/06/20 0810 03/06/20 0837   03/06/20 0900  azithromycin (ZITHROMAX) 500 mg in sodium chloride 0.9 % 250 mL IVPB  Status:  Discontinued        500 mg 250 mL/hr over 60 Minutes Intravenous Every 24 hours 03/06/20 0837 03/09/20 1052   03/06/20 0700  remdesivir 200 mg in sodium chloride 0.9% 250 mL IVPB       "Followed by" Linked Group Details   200 mg 580 mL/hr over 30 Minutes Intravenous Once 03/06/20 0555 03/06/20 0835   03/06/20 0430  vancomycin (VANCOREADY) IVPB 2000 mg/400 mL        2,000 mg 200 mL/hr over 120 Minutes Intravenous  Once 03/06/20 0423 03/06/20 0713   03/06/20 0400  ceFEPIme (MAXIPIME) 2 g in sodium chloride 0.9 % 100 mL IVPB        2 g 200 mL/hr over 30 Minutes Intravenous  Once 03/06/20 0358 03/06/20 0452   03/06/20 0400  metroNIDAZOLE (FLAGYL) IVPB 500 mg        500 mg 100 mL/hr over 60 Minutes Intravenous  Once 03/06/20 0358 03/06/20 0622   03/06/20 0400  vancomycin (VANCOCIN) IVPB 1000 mg/200 mL premix  Status:  Discontinued        1,000 mg 200 mL/hr over 60 Minutes Intravenous  Once 03/06/20 0358 03/06/20 0423      Inpatient Medications  Scheduled Meds: . albuterol  2 puff Inhalation Q6H  . amLODipine  10 mg Oral Daily  . vitamin C  500 mg Oral Daily  . aspirin EC  81 mg Oral Daily  . Chlorhexidine Gluconate Cloth  6 each Topical Daily  . enoxaparin (LOVENOX) injection  40 mg Subcutaneous Q24H  . famotidine  20 mg Oral BID  . insulin aspart  0-15 Units Subcutaneous TID WC  .  metoprolol succinate  75 mg Oral Daily  . QUEtiapine  25 mg Oral QHS  . sodium chloride flush  3 mL Intravenous Q12H  . zinc sulfate  220 mg Oral Daily   Continuous Infusions: . sodium chloride 50 mL/hr at 03/12/20 0857   PRN Meds:.acetaminophen, acetaminophen, dextrose, guaiFENesin-dextromethorphan, haloperidol lactate, hydrALAZINE, melatonin, ondansetron **OR** ondansetron (ZOFRAN) IV, Resource ThickenUp Clear, sodium chloride flush   Time Spent in minutes  25    See all Orders from today for further details   Oren Binet M.D on 03/12/2020 at 12:40 PM  To page go to [email or web address] - use universal password  [facility] -  Office  [phone]    Objective:   Vitals:   03/11/20 2044 03/12/20 0424 03/12/20 0729 03/12/20 1208  BP: 121/79 (!) 179/75 (!) 168/90 131/61  Pulse: 100 81 84 80  Resp: 18 15 15 14   Temp: 98.9 F (37.2 C) 98.7 F (37.1 C) (!) 97.3 F (36.3 C) 97.8 F (36.6 C)  TempSrc: Axillary Axillary Oral Oral  SpO2: 91% 92% 92% 90%  Weight:      Height:  Wt Readings from Last 3 Encounters:  03/06/20 97.5 kg     Intake/Output Summary (Last 24 hours) at 03/12/2020 1240 Last data filed at 03/12/2020 0017 Gross per 24 hour  Intake 430 ml  Output -  Net 430 ml     Physical Exam Gen Exam: Confused but not in any distress. HEENT:atraumatic, normocephalic Chest: B/L clear to auscultation anteriorly CVS:S1S2 regular Abdomen:soft non tender, non distended Extremities:no edema Neurology: Non focal Skin: no rash   Data Review:    CBC Recent Labs  Lab 03/08/20 0203 03/09/20 0433 03/10/20 0500 03/11/20 1005 03/12/20 0046  WBC 8.9 9.8 4.6 5.8 5.3  HGB 13.9 14.3 13.4 13.8 13.1  HCT 41.4 42.1 38.9* 39.1 38.4*  PLT 284 305 265 277 247  MCV 92.2 92.9 92.4 91.8 93.2  MCH 31.0 31.6 31.8 32.4 31.8  MCHC 33.6 34.0 34.4 35.3 34.1  RDW 14.6 14.8 14.7 14.7 15.1  LYMPHSABS 0.3* 0.6* 0.6* 0.8 0.8  MONOABS 0.3 0.4 0.8 1.1* 0.9  EOSABS 0.0  0.0 0.0 0.2 0.2  BASOSABS 0.0 0.0 0.0 0.0 0.0    Chemistries  Recent Labs  Lab 03/08/20 0203 03/09/20 0433 03/10/20 0500 03/11/20 0547 03/12/20 0046  NA 139 142 143 143 147*  K 4.4 4.3 3.7 5.8* 4.4  CL 107 108 107 110 110  CO2 16* 21* 22 21* 25  GLUCOSE 215* 196* 96 95 95  BUN 36* 40* 41* 35* 32*  CREATININE 1.36* 1.30* 1.10 1.08 1.24  CALCIUM 8.1* 8.2* 8.1* 8.3* 8.6*  MG 1.9 2.2 1.8 2.0 1.9  AST 138* 103* 90* 132* 123*  ALT 50* 53* 52* 54* 63*  ALKPHOS 74 82 75 75 80  BILITOT 0.8 0.6 0.8 2.0* 1.0   ------------------------------------------------------------------------------------------------------------------ No results for input(s): CHOL, HDL, LDLCALC, TRIG, CHOLHDL, LDLDIRECT in the last 72 hours.  Lab Results  Component Value Date   HGBA1C 8.7 (H) 03/06/2020   ------------------------------------------------------------------------------------------------------------------ No results for input(s): TSH, T4TOTAL, T3FREE, THYROIDAB in the last 72 hours.  Invalid input(s): FREET3 ------------------------------------------------------------------------------------------------------------------ No results for input(s): VITAMINB12, FOLATE, FERRITIN, TIBC, IRON, RETICCTPCT in the last 72 hours.  Coagulation profile Recent Labs  Lab 03/06/20 0358  INR 1.1    Recent Labs    03/11/20 0547 03/12/20 0046  DDIMER 3.41* 3.70*    Cardiac Enzymes No results for input(s): CKMB, TROPONINI, MYOGLOBIN in the last 168 hours.  Invalid input(s): CK ------------------------------------------------------------------------------------------------------------------    Component Value Date/Time   BNP 87.3 03/06/2020 0820    Micro Results Recent Results (from the past 240 hour(s))  SARS Coronavirus 2 by RT PCR (hospital order, performed in [facility] hospital lab) Nasopharyngeal Nasopharyngeal Swab     Status: Abnormal   Collection Time: 03/06/20  3:58 AM   Specimen:  Nasopharyngeal Swab  Result Value Ref Range Status   SARS Coronavirus 2 POSITIVE (A) NEGATIVE Final    Comment: RESULT CALLED TO, READ BACK BY AND VERIFIED WITH: K GIBSON RN 03/06/20 0538 JDW (NOTE) SARS-CoV-2 target nucleic acids are DETECTED  SARS-CoV-2 RNA is generally detectable in upper respiratory specimens  during the acute phase of infection.  Positive results are indicative  of the presence of the identified virus, but do not rule out bacterial infection or co-infection with other pathogens not detected by the test.  Clinical correlation with patient history and  other diagnostic information is necessary to determine patient infection status.  The expected result is negative.  Fact Sheet for Patients:   StrictlyIdeas.no   Fact Sheet  for Healthcare Providers:   BankingDealers.co.za    This test is not yet approved or cleared by the [place] and  has been authorized for detection and/or diagnosis of SARS-CoV-2 by FDA under an Emergency Use Authorization (EUA).  This EUA will remain in effect (meaning this test can  be used) for the duration of  the COVID-19 declaration under Section 564(b)(1) of the Act, 21 U.S.C. section 360-bbb-3(b)(1), unless the authorization is terminated or revoked sooner.  Performed at [facility], [place] [address], [place], [place] [address]   Blood Culture (routine x 2)     Status: None   Collection Time: 03/06/20  3:58 AM   Specimen: BLOOD RIGHT WRIST  Result Value Ref Range Status   Specimen Description BLOOD RIGHT WRIST  Final   Special Requests   Final    BOTTLES DRAWN AEROBIC AND ANAEROBIC Blood Culture results may not be optimal due to an inadequate volume of blood received in culture bottles   Culture   Final    NO GROWTH 5 DAYS Performed at [facility], [place] [address] [place], [place] [address]    Report Status 03/11/2020 FINAL  Final  Urine culture     Status: None    Collection Time: 03/06/20  3:58 AM   Specimen: In/Out Cath Urine  Result Value Ref Range Status   Specimen Description IN/OUT CATH URINE  Final   Special Requests NONE  Final   Culture   Final    NO GROWTH Performed at [facility], [place] [address] [place], [place] [address]    Report Status 03/07/2020 FINAL  Final  Blood Culture (routine x 2)     Status: None   Collection Time: 03/06/20  4:20 AM   Specimen: BLOOD RIGHT HAND  Result Value Ref Range Status   Specimen Description BLOOD RIGHT HAND  Final   Special Requests   Final    BOTTLES DRAWN AEROBIC AND ANAEROBIC Blood Culture results may not be optimal due to an inadequate volume of blood received in culture bottles   Culture   Final    NO GROWTH 5 DAYS Performed at [facility], [place] [address], [place], [place] [address]    Report Status 03/11/2020 FINAL  Final    Radiology Reports DG Chest Port 1 View  Result Date: 03/06/2020 CLINICAL DATA:  Questionable sepsis EXAM: PORTABLE CHEST 1 VIEW COMPARISON:  10/30/2006 FINDINGS: Infiltrate at the left lung base. Accentuation of markings at the right base which may be atelectasis in the setting of low lung volumes. Cardiomegaly which is stable. No pulmonary edema, effusion, or pneumothorax. IMPRESSION: Low volume chest with infiltrate at the left base. Electronically Signed   By: Monte Fantasia M.D.   On: 03/06/2020 04:13   DG Chest Port 1V same Day  Result Date: 03/07/2020 CLINICAL DATA:  Weakness. EXAM: PORTABLE CHEST 1 VIEW COMPARISON:  March 06, 2020. FINDINGS: Stable cardiomediastinal silhouette. Patchy airspace opacities are noted bilaterally consistent with multifocal pneumonia. No pneumothorax pleural effusion is noted. Bony thorax is unremarkable. IMPRESSION: Bilateral multifocal pneumonia. Electronically Signed   By: Marijo Conception M.D.   On: 03/07/2020 09:24   VAS [place] LOWER EXTREMITY VENOUS (DVT)  Result Date: 03/08/2020  Lower Venous DVT Study Indications:  Covid-19, elevated D-Dimer.  Limitations: Patient with altered mental status and constant movement. Comparison Study: No prior study on file Performing Technologist: Sharion Dove RVS  Examination Guidelines: A complete evaluation includes B-mode  imaging, spectral Doppler, color Doppler, and power Doppler as needed of all accessible portions of each vessel. Bilateral testing is considered an integral part of a complete examination. Limited examinations for reoccurring indications may be performed as noted. The reflux portion of the exam is performed with the patient in reverse Trendelenburg.  +---------+---------------+---------+-----------+----------+-------------------+ RIGHT    CompressibilityPhasicitySpontaneityPropertiesThrombus Aging      +---------+---------------+---------+-----------+----------+-------------------+ CFV      Full           Yes      Yes                                      +---------+---------------+---------+-----------+----------+-------------------+ SFJ      Full                                                             +---------+---------------+---------+-----------+----------+-------------------+ FV Prox  Full                                                             +---------+---------------+---------+-----------+----------+-------------------+ FV Mid   Full                                                             +---------+---------------+---------+-----------+----------+-------------------+ FV DistalFull                                                             +---------+---------------+---------+-----------+----------+-------------------+ PFV      Full                                                             +---------+---------------+---------+-----------+----------+-------------------+ POP      Full           Yes      Yes                                       +---------+---------------+---------+-----------+----------+-------------------+ PTV      Full                                                             +---------+---------------+---------+-----------+----------+-------------------+ PERO  Not well visualized +---------+---------------+---------+-----------+----------+-------------------+   +---------+---------------+---------+-----------+----------+-------------------+ LEFT     CompressibilityPhasicitySpontaneityPropertiesThrombus Aging      +---------+---------------+---------+-----------+----------+-------------------+ CFV      Full           Yes      Yes                                      +---------+---------------+---------+-----------+----------+-------------------+ SFJ      Full                                                             +---------+---------------+---------+-----------+----------+-------------------+ FV Prox  Full           Yes      Yes                                      +---------+---------------+---------+-----------+----------+-------------------+ FV Mid   Full                                                             +---------+---------------+---------+-----------+----------+-------------------+ FV DistalFull                                                             +---------+---------------+---------+-----------+----------+-------------------+ PFV      Full                                                             +---------+---------------+---------+-----------+----------+-------------------+ POP      Full           Yes      Yes                                      +---------+---------------+---------+-----------+----------+-------------------+ PTV      Full                                                             +---------+---------------+---------+-----------+----------+-------------------+  PERO                                                  Not well visualized +---------+---------------+---------+-----------+----------+-------------------+    Summary: RIGHT: - There is no  evidence of deep vein thrombosis in the lower extremity. However, portions of this examination were limited- see technologist comments above.  LEFT: - There is no evidence of deep vein thrombosis in the lower extremity. However, portions of this examination were limited- see technologist comments above.  *See table(s) above for measurements and observations. Electronically signed by Monica Martinez MD on 03/08/2020 at 2:27:10 PM.    Final    [place] EKG SITE RITE  Result Date: 03/08/2020 If Site Rite image not attached, placement could not be confirmed due to current cardiac rhythm.

## 2020-03-13 ENCOUNTER — Inpatient Hospital Stay (HOSPITAL_COMMUNITY): Payer: Medicare Other

## 2020-03-13 DIAGNOSIS — A419 Sepsis, unspecified organism: Secondary | ICD-10-CM | POA: Diagnosis not present

## 2020-03-13 DIAGNOSIS — U071 COVID-19: Secondary | ICD-10-CM | POA: Diagnosis not present

## 2020-03-13 DIAGNOSIS — Z515 Encounter for palliative care: Secondary | ICD-10-CM

## 2020-03-13 DIAGNOSIS — N179 Acute kidney failure, unspecified: Secondary | ICD-10-CM | POA: Diagnosis not present

## 2020-03-13 DIAGNOSIS — J9601 Acute respiratory failure with hypoxia: Secondary | ICD-10-CM | POA: Diagnosis not present

## 2020-03-13 DIAGNOSIS — E1110 Type 2 diabetes mellitus with ketoacidosis without coma: Secondary | ICD-10-CM | POA: Diagnosis not present

## 2020-03-13 DIAGNOSIS — E1111 Type 2 diabetes mellitus with ketoacidosis with coma: Secondary | ICD-10-CM | POA: Diagnosis not present

## 2020-03-13 DIAGNOSIS — R6521 Severe sepsis with septic shock: Secondary | ICD-10-CM

## 2020-03-13 DIAGNOSIS — Z7189 Other specified counseling: Secondary | ICD-10-CM

## 2020-03-13 LAB — GLUCOSE, CAPILLARY
Glucose-Capillary: 154 mg/dL — ABNORMAL HIGH (ref 70–99)
Glucose-Capillary: 182 mg/dL — ABNORMAL HIGH (ref 70–99)
Glucose-Capillary: 238 mg/dL — ABNORMAL HIGH (ref 70–99)
Glucose-Capillary: 228 mg/dL — ABNORMAL HIGH (ref 70–99)

## 2020-03-13 LAB — BASIC METABOLIC PANEL
Anion gap: 8 (ref 5–15)
BUN: 31 mg/dL — ABNORMAL HIGH (ref 8–23)
CO2: 24 mmol/L (ref 22–32)
Calcium: 8.2 mg/dL — ABNORMAL LOW (ref 8.9–10.3)
Chloride: 109 mmol/L (ref 98–111)
Creatinine, Ser: 1.26 mg/dL — ABNORMAL HIGH (ref 0.61–1.24)
GFR, Estimated: 54 mL/min — ABNORMAL LOW (ref >60)
Glucose, Bld: 194 mg/dL — ABNORMAL HIGH (ref 70–99)
Potassium: 4.1 mmol/L (ref 3.5–5.1)
Sodium: 141 mmol/L (ref 135–145)

## 2020-03-13 MED ORDER — INSULIN ASPART 100 UNIT/ML ~~LOC~~ SOLN
3.0000 [IU] | Freq: Once | SUBCUTANEOUS | Status: AC
  Administered 2020-03-13: 3 [IU] via SUBCUTANEOUS

## 2020-03-13 NOTE — Consult Note (Addendum)
Consultation Note Date: 03/13/2020   Patient Name: Joe [name]  DOB: July 24, 1926  MRN: [id number]  Age / Sex: 87 y.o., male   PCP: Patient, No Pcp Per Referring Physician: Jonetta Osgood, MD   REASON FOR CONSULTATION:Establishing goals of care  Palliative Care consult requested for goals of care discussion in this 87 y.o. male with multiple medical problems including   Clinical Assessment and Goals of Care: I have reviewed medical records including lab results, imaging, Epic notes, and MAR. I spoke with patient's daughter, Joe [name] by phone to discuss diagnosis prognosis, [place], EOL wishes, disposition and options.  I introduced Palliative Medicine as specialized medical care for people living with serious illness. It focuses on providing relief from the symptoms and stress of a serious illness. The goal is to improve quality of life for both the patient and the family. She verbalized understanding and appreciation.   We discussed a brief life review of the patient, along with his functional and nutritional status. 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 He is is widowed. She has lived in the home with patient since 2006 after the passing of her mother. He enjoyed being outside and with family.   2 weeks prior to admission patient was ambulatory in the home with a walker. Able to perform most ADLs independently although slow. She shares he would sleep in his recliner and get up every morning to make himself coffee. He drank 2-3 boost per day and 2 large meals.Often snacked on chips and Little Debbie cakes. A week prior to admission patient began declining decreased appetite, weak, and no longer able to stand or walk independently.   We discussed His current illness and what it means in the larger context of His on-going co-morbidities.Natural disease trajectory and expectations at EOL were discussed.  Daughter verbalizes understanding of patient's current illness and  co-morbidities. She and family remains hopeful patient will show some improvement given his "good" quality of life prior to [place] and hospitalization.   I attempted to elicit values and goals of care important to the patient.    Daughter shares although family is hopeful they are also realistic that he is of advanced age with co-morbidities. They wish to allow him an opportunity to improve and if no improvement or stability at SNF rehab their focus will then be to have patient home to spend what time he has left with family.   Advanced directives, concepts specific to code status, artifical feeding and hydration, and rehospitalization were considered and discussed. Patient does have a documented advanced directive. Joe [name] is his named HCPOA. She states he would not want to have any life-prolonging measures such as dialysis or artificial feedings. Confirms DNR/DNI.  Family understands aspiration risk and wishes to follow recommendations but would not be interested in feeding tubes.   Hospice and Palliative Care services outpatient were explained and offered. Dauhgter verbalized their understanding and awareness of both palliative and hospice's goals and philosophy of care. At this time she would like outpatient Palliative follow up with awareness family may transition care at any time to a more comfort/hospice focus by communicating with the medical team.   Questions and concerns were addressed.   The family was encouraged to call with questions or concerns.  PMT will continue to support holistically.   SOCIAL HISTORY:       CODE STATUS: DNR  ADVANCE DIRECTIVES: Primary Decision Maker: Joe [name] (daughter/POA)    SYMPTOM MANAGEMENT: per  attending   Palliative Prophylaxis:   Aspiration, Bowel Regimen, Delirium Protocol, Eye Care, Frequent Pain Assessment and Oral Care  PSYCHO-SOCIAL/SPIRITUAL:  Support System: Family  Desire for further Chaplaincy support:Did not address    Additional Recommendations (Limitations, Scope, Preferences):  No Artificial Feeding and Treat the treatable, no escalation, DNR/DNI  Education on hospice/palliative    PAST MEDICAL HISTORY: Past Medical History:  Diagnosis Date  . Diabetes mellitus without complication ([place])   . Hypertension   . Solitary kidney, congenital     ALLERGIES:  has No Known Allergies.   MEDICATIONS:  Current Facility-Administered Medications  Medication Dose Route Frequency Provider Last Rate Last Admin  . 0.45 % sodium chloride infusion   Intravenous Continuous Jonetta Osgood, MD 10 mL/hr at 03/13/20 1500 Rate Change at 03/13/20 1500  . acetaminophen (TYLENOL) suppository 650 mg  650 mg Rectal Q6H PRN Fuller Plan A, MD      . acetaminophen (TYLENOL) tablet 650 mg  650 mg Oral Q6H PRN Fuller Plan A, MD   650 mg at 03/10/20 9622  . albuterol (VENTOLIN HFA) 108 (90 Base) MCG/ACT inhaler 2 puff  2 puff Inhalation Q6H Fuller Plan A, MD   2 puff at 03/13/20 0841  . amLODipine (NORVASC) tablet 10 mg  10 mg Oral Daily Jonetta Osgood, MD   10 mg at 03/13/20 0834  . ascorbic acid (VITAMIN C) tablet 500 mg  500 mg Oral Daily Tamala Julian, Rondell A, MD   500 mg at 03/11/20 0955  . aspirin EC tablet 81 mg  81 mg Oral Daily Fuller Plan A, MD   81 mg at 03/11/20 0955  . Chlorhexidine Gluconate Cloth 2 % PADS 6 each  6 each Topical Daily Jonetta Osgood, MD   6 each at 03/13/20 [id number]  . dextrose 50 % solution 0-50 mL  0-50 mL Intravenous PRN Cardama, Grayce Sessions, MD      . enoxaparin (LOVENOX) injection 40 mg  40 mg Subcutaneous Q24H Fuller Plan A, MD   40 mg at 03/13/20 0834  . famotidine (PEPCID) tablet 20 mg  20 mg Oral BID Jonetta Osgood, MD   20 mg at 03/12/20 2127  . guaiFENesin-dextromethorphan (ROBITUSSIN DM) 100-10 MG/5ML syrup 10 mL  10 mL Oral Q4H PRN Smith, Rondell A, MD      . haloperidol lactate (HALDOL) injection 1 mg  1 mg Intravenous Q6H PRN Opyd, Ilene Qua, MD      .  hydrALAZINE (APRESOLINE) injection 10 mg  10 mg Intravenous Q6H PRN Ghimire, Shanker M, MD      . insulin aspart (novoLOG) injection 0-15 Units  0-15 Units Subcutaneous TID WC Fuller Plan A, MD   3 Units at 03/13/20 1413  . melatonin tablet 3 mg  3 mg Oral QHS PRN Shela Leff, MD   3 mg at 03/12/20 2127  . metoprolol succinate (TOPROL-XL) 24 hr tablet 75 mg  75 mg Oral Daily Jonetta Osgood, MD   75 mg at 03/13/20 0834  . ondansetron (ZOFRAN) tablet 4 mg  4 mg Oral Q6H PRN Fuller Plan A, MD       Or  . ondansetron (ZOFRAN) injection 4 mg  4 mg Intravenous Q6H PRN Smith, Rondell A, MD      . QUEtiapine (SEROQUEL) tablet 25 mg  25 mg Oral QHS Jonetta Osgood, MD   25 mg at 03/12/20 2127  . Resource ThickenUp Clear   Oral PRN Jonetta Osgood, MD      .  sodium chloride flush (NS) 0.9 % injection 10-40 mL  10-40 mL Intracatheter PRN Ghimire, Henreitta Leber, MD      . sodium chloride flush (NS) 0.9 % injection 3 mL  3 mL Intravenous Q12H Smith, Rondell A, MD   3 mL at 03/13/20 0844  . zinc sulfate capsule 220 mg  220 mg Oral Daily Tamala Julian, Rondell A, MD   220 mg at 03/11/20 0955    VITAL SIGNS: BP (!) 117/56 (BP Location: Right Arm)   Pulse 71   Temp (!) 97.5 F (36.4 C) (Axillary)   Resp 18   Ht 5\' 10"  (1.778 m)   Wt 97.5 kg   SpO2 92%   BMI 30.85 kg/m  Filed Weights   03/06/20 0422  Weight: 97.5 kg    Estimated body mass index is 30.85 kg/m as calculated from the following:   Height as of this encounter: 5\' 10"  (1.778 m).   Weight as of this encounter: 97.5 kg.  LABS: CBC:    Component Value Date/Time   WBC 5.3 03/12/2020 0046   HGB 13.1 03/12/2020 0046   HCT 38.4 (L) 03/12/2020 0046   PLT 247 03/12/2020 0046   Comprehensive Metabolic Panel:    Component Value Date/Time   NA 141 03/13/2020 0200   K 4.1 03/13/2020 0200   BUN 31 (H) 03/13/2020 0200   CREATININE 1.26 (H) 03/13/2020 0200   ALBUMIN 2.5 (L) 03/12/2020 0046     Review of Systems  Unable to  perform ROS: Dementia   The above conversation was completed via telephone due to the visitor restrictions during the COVID-19 pandemic. Thorough chart review and discussion with necessary members of the care team was completed as part of assessment. All issues were discussed and addressed but no physical exam was performed.   Prognosis: Guarded  Discharge Planning:  [place] for rehab with Palliative care service follow-up  Recommendations: . DNR/DNI-as confirmed by daughter/POA . Continue with current plan of care per medical team  . Daughter remains hopeful for some improvement/stability. Wishes to allow patient and opportunity to do so by transitioning to SNF rehab. If further declines or no improvement would then like to focus on patient's comfort in the home with family.  . Daughter requesting outpatient Palliative support.  Marland Kitchen PMT will continue to support and follow as needed. Please call team line with urgent needs.   Palliative Performance Scale: PPS 30%              Daughter, Webb Silversmith expressed understanding and was in agreement with this plan.   Thank you for allowing the Palliative Medicine Team to assist in the care of this patient.  Time In: 1530 Time Out: 1625 Time Total: 55 min.   Visit consisted of counseling and education dealing with the complex and emotionally intense issues of symptom management and palliative care in the setting of serious and potentially life-threatening illness.Greater than 50%  of this time was spent counseling and coordinating care related to the above assessment and plan.  Signed by:  Alda Lea, AGPCNP-BC Palliative Medicine Team  Phone: [phone] Pager: [phone] Amion: Bjorn Pippin

## 2020-03-13 NOTE — Progress Notes (Signed)
Modified Barium Swallow Progress Note  Patient Details  Name: Joe [name] MRN: [id number] Date of Birth: Mar 31, 1926  Today's Date: 03/13/2020  Modified Barium Swallow completed.  Full report located under Chart Review in the Imaging Section.  Brief recommendations include the following:  Clinical Impression  Pt presents with oropharyngeal dysphagia characterized by reduced posterior bolus propulsion, prolonged and inconsistent mastication, reduced bolus cohesion, a pharyngeal delay, reduced lingual retraction, reduced pharyngeal constriction, reduced hyolaryngeal elevation and reduced anterior laryngeal movement. He demonstrated premature spillage to the pyriform sinuses, base of tongue residue, mild vallecular residue, posterior pharyngeal residue, pyriform sinus residue, and inconsistent incomplete epiglottic movement. Pt's swallow was often triggered with at least the head of the liquid bolus at the level of the pyriform sinuses with liquids. This resulted in penetration (PAS 5) and aspiration (PAS 7) of nectar thick liquids via straw and of thin liquids via cup/straw. Aspiration resulted in throat clearing and an occasional cough which was ineffective in expelling the aspirate. Pt was unable to demonstrate a cough despite prompts due to difficulty following commands. Aspirated material was mobilized superior to the vocal folds with throat clearing and exhilation, but subsequently aspirated again. Per conversation with Dr. Nena Alexander, pt's family would like the pt to remain on a p.o. diet at this time with known aspiration risk. A dysphagia 1 (puree) diet with nectar thick liquids is recommended at this time with strict observance of the swallowing precautions listed below to help mitigate aspiration risk as well as the risk of pulmonary-related complications. Pt is still at risk of aspiration with this diet, but honey thick liquids poses a more significant risk due to pharyngeal residue. SLP  will continue to follow pt briefly.   Swallow Evaluation Recommendations       SLP Diet Recommendations: Dysphagia 1 (Puree) solids;Nectar thick liquid   Liquid Administration via: Cup;No straw   Medication Administration: Crushed with puree   Supervision: Full assist for feeding;Full supervision/cueing for compensatory strategies   Compensations: Slow rate;Small sips/bites;Follow solids with liquid (Individual sips only)   Postural Changes: Remain semi-upright after after feeds/meals (Comment)   Oral Care Recommendations: Oral care BID   Other Recommendations: Order thickener from [place] [facility], [place], [place] Office number [phone] Pager [phone]  Horton Marshall 03/13/2020,4:07 PM

## 2020-03-13 NOTE — TOC Progression Note (Addendum)
Transition of Care [facility]) - Progression Note    Patient Details  Name: Joe [name] MRN: [id number] Date of Birth: 29-Jan-1927  [facility] CM/SW [place], LCSW Phone Number: 03/13/2020, 2:24 PM  Clinical Narrative:    CSW contacted [facility] regarding possible need for bed tomorrow; they would be unable to accept patient until Monday. [facility] also not able to accept patient until Monday.   Expected Discharge Plan: Skilled Nursing Facility Barriers to Discharge: SNF Covid,SNF Covid Recovering  Expected Discharge Plan and Services Expected Discharge Plan: [place] In-house Referral: NA Discharge Planning Services: NA Post Acute Care Choice: [place] Living arrangements for the past 2 months: Single Family Home                 DME Arranged: N/A DME Agency: NA       HH Arranged: NA HH Agency: NA         Social Determinants of Health (SDOH) Interventions    Readmission Risk Interventions No flowsheet data found.

## 2020-03-13 NOTE — Progress Notes (Signed)
Physical Therapy Treatment Patient Details Name: Joe [name] MRN: [id number] DOB: 28-Dec-1926 Today's Date: 03/13/2020    History of Present Illness Pt is a 87 y.o. male admitted 03/06/20 with confusion, weakness, and nausea/vomiting. Workup for acute metabolic encephalopathy, DKA, sepsis, acute hypoxic respiratory failure due to COVID-19 PNA. PMH includes HTN, DM2.   PT Comments    Pt with increased fatigue and decreased mobility initiation this session. Pt requiring modA+1 for bed mobility, difficulty achieving fully upright standing despite maxA, requiring maxA+2 to pivot to recliner. Pt reports "I'm too weak" - minimal verbalizations this session, but pt still following majority of simple, gestural commands. Difficult to assess true cognitive impairment due to pt's very HOH. Continue to recommend SNF-level therapies to maximize functional mobility and decrease caregiver burden.   Follow Up Recommendations  SNF;Supervision/Assistance - 24 hour     Equipment Recommendations   (defer)    Recommendations for Other Services       Precautions / Restrictions Precautions Precautions: Fall;Other (comment) Precaution Comments: Bilateral soft mitts Restrictions Weight Bearing Restrictions: No    Mobility  Bed Mobility Overal bed mobility: Needs Assistance       Supine to sit: Mod assist     General bed mobility comments: ModA to initiate BLE movement to EOB, pt eventually following gestural/verbal commands to sit up with modA for trunk elevation and scooting hips to EOB    Transfers Overall transfer level: Needs assistance Equipment used: Rolling walker (2 wheeled);None Transfers: Sit to/from [facility] Sit to Stand: Max assist;+2 physical assistance Stand pivot transfers: Max assist;+2 physical assistance       General transfer comment: Initially attempted standing to RW, pt requiring maxA+2 to elevate trunk with difficulty achieving fully upright  posture, poor initiation to extend trunk/hips despite cues requiring return to sit; additional stand pivot from bed to recliner with maxA+2, pt still with decreased initiation, appears fatigued  Ambulation/Gait                 Stairs             Wheelchair Mobility    Modified Rankin (Stroke Patients Only)       Balance Overall balance assessment: Needs assistance   Sitting balance-Leahy Scale: Poor Sitting balance - Comments: Pt reliant on UE support and intermittent minA to maintain upright sitting balance Postural control: Posterior lean   Standing balance-Leahy Scale: Zero                              Cognition Arousal/Alertness: Awake/alert (fatigued) Behavior During Therapy: Flat affect Overall Cognitive Status: Difficult to assess                                 General Comments: Pt not combative/agitated this session. Difficult to determine cognition due to significant HOH; pt following some gestural commands well related to mobility, but seems limited by fatigue with minimal verbalizations this session. Pt stating not wanting to get to chair, but then agreeable to let PT/NT assist him OOB; reports, "I'm too weak" - speech difficult to understand at times      Exercises      General Comments General comments (skin integrity, edema, etc.): SpO2 95% on 2L at rest, down to at least 88% on RA with activity; 2L O2 replaced at end of session. Pt left eating breakfast with NT assist,  pt dependent for this, not attempting to feed self      Pertinent Vitals/Pain Pain Assessment: Faces Faces Pain Scale: No hurt Pain Intervention(s): Monitored during session    Home Living                      Prior Function            PT Goals (current goals can now be found in the care plan section) Progress towards PT goals: Not progressing toward goals - comment (increased fatigue this session)    Frequency    Min  2X/week      PT Plan Current plan remains appropriate    Co-evaluation              AM-PAC PT "6 Clicks" Mobility   Outcome Measure  Help needed turning from your back to your side while in a flat bed without using bedrails?: A Lot Help needed moving from lying on your back to sitting on the side of a flat bed without using bedrails?: A Lot Help needed moving to and from a bed to a chair (including a wheelchair)?: A Lot Help needed standing up from a chair using your arms (e.g., wheelchair or bedside chair)?: A Lot Help needed to walk in hospital room?: Total Help needed climbing 3-5 steps with a railing? : Total 6 Click Score: 10    End of Session Equipment Utilized During Treatment: Gait belt;Oxygen Activity Tolerance: Patient limited by fatigue Patient left: in chair;with call bell/phone within reach;with chair alarm set;with nursing/sitter in room Nurse Communication: Mobility status PT Visit Diagnosis: Other abnormalities of gait and mobility (R26.89);Muscle weakness (generalized) (M62.81)     Time: 4496-7591 PT Time Calculation (min) (ACUTE ONLY): 13 min  Charges:  $Therapeutic Activity: 8-22 mins                    Mabeline Caras, PT, DPT Acute Rehabilitation Services  Pager [phone] Office [phone]  Derry Lory 03/13/2020, 11:15 AM

## 2020-03-13 NOTE — Progress Notes (Signed)
PROGRESS NOTE                                                                                                                                                                                                             Patient Demographics:    Joe [name], is a 87 y.o. male, DOB - 1926/10/28, JAS:[id number]  Outpatient Primary MD for the patient is Patient, No Pcp Per   Admit date - 03/06/2020   LOS - 7  Chief Complaint  Patient presents with  . Emesis       Brief Narrative: Patient is a 87 y.o. male with PMHx of HTN, DM-2, solitary kidney-presenting with confusion/nausea and vomiting-found to have acute metabolic encephalopathy, acute hypoxic respiratory failure, DKA due to COVID-19 infection. See below for further details.   COVID-19 vaccinated status:   Significant Events: 2/5>> Admit to [facility] for sepsis/hypoxia (on 6 L of HFNC)-due to COVID-19, AKI and DKA  Significant studies: 2/4>> chest x-ray: Left base infiltrate 2/5>>Chest x-ray: Bilateral multifocal pneumonia 2/5>> bilateral lower extremity Doppler: No DVT.  COVID-19 medications: Steroids: 2/4>>2/10 Remdesivir: 2/4>> 2/8 Actemra: X1 on 2/4  Antibiotics: Zithromax: 2/4>>2/6 Rocephin: 2/4>> 2/8 Cefepime: 2/4 x 1 Flagyl: 2/4 x 1 Vancomycin: 2/4 x 1  Microbiology data: 2/4 >>blood culture: No growth 2/4>> urine culture: No growth  Procedures: None  Consults: Palliative care  DVT prophylaxis: enoxaparin (LOVENOX) injection 40 mg Start: 03/06/20 0900    Subjective:   More awake and alert compared to yesterday-per RN staff-he did FINALLY eat a decent amount yesterday.    Assessment  & Plan :   Acute Hypoxic Resp Failure due to Covid 19 Viral pneumonia +/-bacterial pneumonia: Remains stable on 2 L of oxygen-slightly more awake and alert-has completed a course of antimicrobial therapy and steroids.  Due to waxing and waning  encephalopathy/confusion-he remains at significant risk of aspiration.    Fever: Afebrile. O2 requirements:  SpO2: 92 % O2 Flow Rate (L/min): 2 L/min   COVID-19 Labs: Recent Labs    03/11/20 0547 03/12/20 0046  DDIMER 3.41* 3.70*  CRP 1.4* 1.0*       Component Value Date/Time   BNP 87.3 03/06/2020 0820    Recent Labs  Lab 03/07/20 0653 03/08/20 0203 03/09/20 0433  PROCALCITON 5.74 3.75 2.15    Lab Results  Component Value Date  SARSCOV2NAA POSITIVE (A) 03/06/2020     Prone/Incentive Spirometry: Encouraged incentive spirometry use-but confused-will not follow commands.  Elevated D-dimer: Likely due to COVID-19 related inflammation-lower extremity Dopplers negative-with improving hypoxemia-doubt further work-up is required.  On prophylactic Lovenox.  Sepsis due to COVID-19 pneumonia +/-bacterial pneumonia: Sepsis physiology has resolved-cultures negative so far-has completed a course of antimicrobial therapy  Acute metabolic encephalopathy: Multifactorial-from sepsis/hypoxemia/DKA/steroid use-has significant hearing impairment at baseline-probably has some amount of cognitive dysfunction as well.  Continues to have confusion-but seems to wax and wane in severity at times.  He remains on Seroquel-suspect he will continue to have AMS/delirium as long as hospitalized.  Dysphagia: Due to severe deconditioning-probably has some amount of dysphagia at baseline-SLP following-with plans for possible MBS later today.  Family accepting all risk of aspiration-no plans to start tube feeds at all.  DKA: Overall improved-transitioned off insulin infusion-on SQ insulin  Acute kidney injury: Likely hemodynamically mediated-has resolved.  Has congenital solitary kidney.   Hypernatremia: Resolved with hypotonic saline.  Secondary to poor oral intake.  Hypomagnesemia: Repleted  HTN: BP stable-continue metoprolol and amlodipine.  Reassess in the next 1-2 days for further adjustment.     Insulin-dependent DM-2 (A1c 8.7 on 2/4): CBGs stable-poor oral intake-at risk for hypoglycemia-continue SSI.  Not a candidate for aggressive glycemic control.  Allow permissive hyperglycemia.     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  Goals of care are for gentle medical treatment-if he deteriorates significantly-to transition to comfort measures.  He however  continues to wax and wane-there are days where he is more awake-and is eating-and days where he is just lethargic and not eating.  Electrolytes have stabilized-he is completed all antimicrobial therapy-he remains tenuous- will touch base with family/palliative care to see what the appropriate disposition is -residential hospice vs SNF with palliative care follow-up  Hard of hearing  Obesity: Estimated body mass index is 30.85 kg/m as calculated from the following:   Height as of this encounter: 5\' 10"  (1.778 m).   Weight as of this encounter: 97.5 kg.   GI prophylaxis: H2 Blocker  ABG:    Component Value Date/Time   HCO3 16.6 (L) 03/06/2020 0659   TCO2 18 (L) 03/06/2020 0659   ACIDBASEDEF 9.0 (H) 03/06/2020 0659   O2SAT 100.0 03/06/2020 0659    Vent Settings: N/A    Condition - Extremely Guarded  Family Communication  :  Daughter-[phone] left a voicemail on 2/11  Code Status :  DNR  Diet :  Diet Order            DIET DYS 2 Room service appropriate? Yes with Assist; Fluid consistency: Nectar Thick  Diet effective now                  Disposition Plan  :   Status is: Inpatient Remains inpatient appropriate because:Inpatient level of care appropriate due to severity of illness   Dispo: The patient is from: Home              Anticipated  d/c is to: TBD              Anticipated d/c date is: > 3 days              Patient currently is  not medically stable to d/c.   Difficult to place patient No   Barriers to discharge: Hypoxia requiring O2 supplementation/complete 5 days of IV Remdesivir  Antimicorbials  :    Anti-infectives (From admission, onward)   Start     Dose/Rate Route Frequency Ordered Stop   03/07/20 1000  remdesivir 100 mg in sodium chloride 0.9 % 100 mL IVPB       "Followed by" Linked Group Details   100 mg 200 mL/hr over 30 Minutes Intravenous Daily 03/06/20 0555 03/11/20 0804   03/06/20 1600  cefTRIAXone (ROCEPHIN) 1 g in sodium chloride 0.9 % 100 mL IVPB        1 g 200 mL/hr over 30 Minutes Intravenous Every 24 hours 03/06/20 0810 03/10/20 2135   03/06/20 1400  azithromycin (ZITHROMAX) 500 mg in sodium chloride 0.9 % 250 mL IVPB  Status:  Discontinued        500 mg 250 mL/hr over 60 Minutes Intravenous Every 24 hours 03/06/20 0810 03/06/20 0837   03/06/20 0900  azithromycin (ZITHROMAX) 500 mg in sodium chloride 0.9 % 250 mL IVPB  Status:  Discontinued        500 mg 250 mL/hr over 60 Minutes Intravenous Every 24 hours 03/06/20 0837 03/09/20 1052   03/06/20 0700  remdesivir 200 mg in sodium chloride 0.9% 250 mL IVPB       "Followed by" Linked Group Details   200 mg 580 mL/hr over 30 Minutes Intravenous Once 03/06/20 0555 03/06/20 0835   03/06/20 0430  vancomycin (VANCOREADY) IVPB 2000 mg/400 mL        2,000 mg 200 mL/hr over 120 Minutes Intravenous  Once 03/06/20 0423 03/06/20 0713   03/06/20 0400  ceFEPIme (MAXIPIME) 2 g in sodium chloride 0.9 % 100 mL IVPB        2 g 200 mL/hr over 30 Minutes Intravenous  Once 03/06/20 0358 03/06/20 0452   03/06/20 0400  metroNIDAZOLE (FLAGYL) IVPB 500 mg        500 mg 100 mL/hr over 60 Minutes Intravenous  Once 03/06/20 0358 03/06/20 0622   03/06/20 0400  vancomycin (VANCOCIN) IVPB 1000 mg/200 mL premix  Status:  Discontinued        1,000 mg 200 mL/hr over 60  Minutes Intravenous  Once 03/06/20 0358 03/06/20 0423      Inpatient Medications  Scheduled Meds: . albuterol  2 puff Inhalation Q6H  . amLODipine  10 mg Oral Daily  . vitamin C  500 mg Oral Daily  . aspirin EC  81 mg Oral Daily  . Chlorhexidine Gluconate Cloth  6 each Topical Daily  . enoxaparin (LOVENOX) injection  40 mg Subcutaneous Q24H  . famotidine  20 mg Oral BID  . insulin aspart  0-15 Units Subcutaneous TID WC  . metoprolol succinate  75 mg Oral Daily  . QUEtiapine  25 mg Oral QHS  . sodium chloride flush  3 mL Intravenous Q12H  . zinc sulfate  220 mg Oral Daily   Continuous Infusions: . sodium chloride 50 mL/hr at 03/12/20 1400   PRN Meds:.acetaminophen, acetaminophen, dextrose, guaiFENesin-dextromethorphan, haloperidol lactate, hydrALAZINE, melatonin, ondansetron **OR** ondansetron (ZOFRAN) IV, Resource ThickenUp Clear, sodium chloride flush   Time Spent in minutes  25    See all Orders from today for further details   Oren Binet M.D on 03/13/2020 at 12:32 PM  To page go to [email or web address] - use universal password  [facility] -  Office  [phone]    Objective:  Vitals:   03/12/20 2010 03/13/20 0428 03/13/20 0639 03/13/20 1203  BP: 135/72 (!) 130/58 (!) 164/87 (!) 117/56  Pulse: 85 75 77 71  Resp: 17 18 (!) 22 18  Temp: 98 F (36.7 C) 98.1 F (36.7 C) 97.8 F (36.6 C) (!) 97.5 F (36.4 C)  TempSrc: Axillary Oral Oral Axillary  SpO2: (!) 89% 90% (!) 88% 92%  Weight:      Height:        Wt Readings from Last 3 Encounters:  03/06/20 97.5 kg     Intake/Output Summary (Last 24 hours) at 03/13/2020 1232 Last data filed at 03/13/2020 0901 Gross per 24 hour  Intake 960 ml  Output 300 ml  Net 660 ml     Physical Exam Gen Exam: Confused-but follows commands-not in any distress. HEENT:atraumatic, normocephalic Chest: B/L clear to auscultation anteriorly CVS:S1S2 regular Abdomen:soft non tender, non distended Extremities:no  edema Neurology: Generalized weakness-but nonfocal exam Skin: no rash   Data Review:    CBC Recent Labs  Lab 03/08/20 0203 03/09/20 0433 03/10/20 0500 03/11/20 1005 03/12/20 0046  WBC 8.9 9.8 4.6 5.8 5.3  HGB 13.9 14.3 13.4 13.8 13.1  HCT 41.4 42.1 38.9* 39.1 38.4*  PLT 284 305 265 277 247  MCV 92.2 92.9 92.4 91.8 93.2  MCH 31.0 31.6 31.8 32.4 31.8  MCHC 33.6 34.0 34.4 35.3 34.1  RDW 14.6 14.8 14.7 14.7 15.1  LYMPHSABS 0.3* 0.6* 0.6* 0.8 0.8  MONOABS 0.3 0.4 0.8 1.1* 0.9  EOSABS 0.0 0.0 0.0 0.2 0.2  BASOSABS 0.0 0.0 0.0 0.0 0.0    Chemistries  Recent Labs  Lab 03/08/20 0203 03/09/20 0433 03/10/20 0500 03/11/20 0547 03/12/20 0046 03/13/20 0200  NA 139 142 143 143 147* 141  K 4.4 4.3 3.7 5.8* 4.4 4.1  CL 107 108 107 110 110 109  CO2 16* 21* 22 21* 25 24  GLUCOSE 215* 196* 96 95 95 194*  BUN 36* 40* 41* 35* 32* 31*  CREATININE 1.36* 1.30* 1.10 1.08 1.24 1.26*  CALCIUM 8.1* 8.2* 8.1* 8.3* 8.6* 8.2*  MG 1.9 2.2 1.8 2.0 1.9  --   AST 138* 103* 90* 132* 123*  --   ALT 50* 53* 52* 54* 63*  --   ALKPHOS 74 82 75 75 80  --   BILITOT 0.8 0.6 0.8 2.0* 1.0  --    ------------------------------------------------------------------------------------------------------------------ No results for input(s): CHOL, HDL, LDLCALC, TRIG, CHOLHDL, LDLDIRECT in the last 72 hours.  Lab Results  Component Value Date   HGBA1C 8.7 (H) 03/06/2020   ------------------------------------------------------------------------------------------------------------------ No results for input(s): TSH, T4TOTAL, T3FREE, THYROIDAB in the last 72 hours.  Invalid input(s): FREET3 ------------------------------------------------------------------------------------------------------------------ No results for input(s): VITAMINB12, FOLATE, FERRITIN, TIBC, IRON, RETICCTPCT in the last 72 hours.  Coagulation profile No results for input(s): INR, PROTIME in the last 168 hours.  Recent Labs     03/11/20 0547 03/12/20 0046  DDIMER 3.41* 3.70*    Cardiac Enzymes No results for input(s): CKMB, TROPONINI, MYOGLOBIN in the last 168 hours.  Invalid input(s): CK ------------------------------------------------------------------------------------------------------------------    Component Value Date/Time   BNP 87.3 03/06/2020 0820    Micro Results Recent Results (from the past 240 hour(s))  SARS Coronavirus 2 by RT PCR (hospital order, performed in [facility] hospital lab) Nasopharyngeal Nasopharyngeal Swab     Status: Abnormal   Collection Time: 03/06/20  3:58 AM   Specimen: Nasopharyngeal Swab  Result Value Ref Range Status   SARS Coronavirus 2 POSITIVE (A) NEGATIVE Final  Comment: RESULT CALLED TO, READ BACK BY AND VERIFIED WITH: K GIBSON RN 03/06/20 0538 JDW (NOTE) SARS-CoV-2 target nucleic acids are DETECTED  SARS-CoV-2 RNA is generally detectable in upper respiratory specimens  during the acute phase of infection.  Positive results are indicative  of the presence of the identified virus, but do not rule out bacterial infection or co-infection with other pathogens not detected by the test.  Clinical correlation with patient history and  other diagnostic information is necessary to determine patient infection status.  The expected result is negative.  Fact Sheet for Patients:   StrictlyIdeas.no   Fact Sheet for Healthcare Providers:   BankingDealers.co.za    This test is not yet approved or cleared by the [place] FDA and  has been authorized for detection and/or diagnosis of SARS-CoV-2 by FDA under an Emergency Use Authorization (EUA).  This EUA will remain in effect (meaning this test can  be used) for the duration of  the COVID-19 declaration under Section 564(b)(1) of the Act, 21 U.S.C. section 360-bbb-3(b)(1), unless the authorization is terminated or revoked sooner.  Performed at [facility],  [place] [address], [place], [place] [address]   Blood Culture (routine x 2)     Status: None   Collection Time: 03/06/20  3:58 AM   Specimen: BLOOD RIGHT WRIST  Result Value Ref Range Status   Specimen Description BLOOD RIGHT WRIST  Final   Special Requests   Final    BOTTLES DRAWN AEROBIC AND ANAEROBIC Blood Culture results may not be optimal due to an inadequate volume of blood received in culture bottles   Culture   Final    NO GROWTH 5 DAYS Performed at [facility], [place] [address], [place], [place] [address]    Report Status 03/11/2020 FINAL  Final  Urine culture     Status: None   Collection Time: 03/06/20  3:58 AM   Specimen: In/Out Cath Urine  Result Value Ref Range Status   Specimen Description IN/OUT CATH URINE  Final   Special Requests NONE  Final   Culture   Final    NO GROWTH Performed at [facility], [place] [address], [place], [place] [address]    Report Status 03/07/2020 FINAL  Final  Blood Culture (routine x 2)     Status: None   Collection Time: 03/06/20  4:20 AM   Specimen: BLOOD RIGHT HAND  Result Value Ref Range Status   Specimen Description BLOOD RIGHT HAND  Final   Special Requests   Final    BOTTLES DRAWN AEROBIC AND ANAEROBIC Blood Culture results may not be optimal due to an inadequate volume of blood received in culture bottles   Culture   Final    NO GROWTH 5 DAYS Performed at [facility], [place] [address], [place], [place] [address]    Report Status 03/11/2020 FINAL  Final    Radiology Reports DG Chest Port 1 View  Result Date: 03/06/2020 CLINICAL DATA:  Questionable sepsis EXAM: PORTABLE CHEST 1 VIEW COMPARISON:  10/30/2006 FINDINGS: Infiltrate at the left lung base. Accentuation of markings at the right base which may be atelectasis in the setting of low lung volumes. Cardiomegaly which is stable. No pulmonary edema, effusion, or pneumothorax. IMPRESSION: Low volume chest with infiltrate at the left base. Electronically Signed   By:  Monte Fantasia M.D.   On: 03/06/2020 04:13   DG Chest Port 1V same Day  Result Date: 03/07/2020 CLINICAL DATA:  Weakness. EXAM: PORTABLE CHEST 1 VIEW COMPARISON:  March 06, 2020. FINDINGS: Stable cardiomediastinal silhouette. Patchy airspace opacities are noted bilaterally consistent with multifocal pneumonia. No pneumothorax pleural effusion is noted. Bony thorax is unremarkable. IMPRESSION: Bilateral multifocal pneumonia. Electronically Signed   By: Marijo Conception M.D.   On: 03/07/2020 09:24   VAS [place] LOWER EXTREMITY VENOUS (DVT)  Result Date: 03/08/2020  Lower Venous DVT Study Indications: Covid-19, elevated D-Dimer.  Limitations: Patient with altered mental status and constant movement. Comparison Study: No prior study on file Performing Technologist: Sharion Dove RVS  Examination Guidelines: A complete evaluation includes B-mode imaging, spectral Doppler, color Doppler, and power Doppler as needed of all accessible portions of each vessel. Bilateral testing is considered an integral part of a complete examination. Limited examinations for reoccurring indications may be performed as noted. The reflux portion of the exam is performed with the patient in reverse Trendelenburg.  +---------+---------------+---------+-----------+----------+-------------------+ RIGHT    CompressibilityPhasicitySpontaneityPropertiesThrombus Aging      +---------+---------------+---------+-----------+----------+-------------------+ CFV      Full           Yes      Yes                                      +---------+---------------+---------+-----------+----------+-------------------+ SFJ      Full                                                             +---------+---------------+---------+-----------+----------+-------------------+ FV Prox  Full                                                             +---------+---------------+---------+-----------+----------+-------------------+ FV Mid    Full                                                             +---------+---------------+---------+-----------+----------+-------------------+ FV DistalFull                                                             +---------+---------------+---------+-----------+----------+-------------------+ PFV      Full                                                             +---------+---------------+---------+-----------+----------+-------------------+ POP      Full           Yes      Yes                                      +---------+---------------+---------+-----------+----------+-------------------+  PTV      Full                                                             +---------+---------------+---------+-----------+----------+-------------------+ PERO                                                  Not well visualized +---------+---------------+---------+-----------+----------+-------------------+   +---------+---------------+---------+-----------+----------+-------------------+ LEFT     CompressibilityPhasicitySpontaneityPropertiesThrombus Aging      +---------+---------------+---------+-----------+----------+-------------------+ CFV      Full           Yes      Yes                                      +---------+---------------+---------+-----------+----------+-------------------+ SFJ      Full                                                             +---------+---------------+---------+-----------+----------+-------------------+ FV Prox  Full           Yes      Yes                                      +---------+---------------+---------+-----------+----------+-------------------+ FV Mid   Full                                                             +---------+---------------+---------+-----------+----------+-------------------+ FV DistalFull                                                              +---------+---------------+---------+-----------+----------+-------------------+ PFV      Full                                                             +---------+---------------+---------+-----------+----------+-------------------+ POP      Full           Yes      Yes                                      +---------+---------------+---------+-----------+----------+-------------------+ PTV      Full                                                             +---------+---------------+---------+-----------+----------+-------------------+  PERO                                                  Not well visualized +---------+---------------+---------+-----------+----------+-------------------+    Summary: RIGHT: - There is no evidence of deep vein thrombosis in the lower extremity. However, portions of this examination were limited- see technologist comments above.  LEFT: - There is no evidence of deep vein thrombosis in the lower extremity. However, portions of this examination were limited- see technologist comments above.  *See table(s) above for measurements and observations. Electronically signed by Monica Martinez MD on 03/08/2020 at 2:27:10 PM.    Final    [place] EKG SITE RITE  Result Date: 03/08/2020 If Site Rite image not attached, placement could not be confirmed due to current cardiac rhythm.

## 2020-03-13 NOTE — Plan of Care (Signed)
  Problem: Health Behavior/Discharge Planning: Goal: Ability to manage health-related needs will improve Outcome: Progressing   Problem: Clinical Measurements: Goal: Will remain free from infection Outcome: Progressing Goal: Respiratory complications will improve Outcome: Progressing   Problem: Safety: Goal: Ability to remain free from injury will improve Outcome: Progressing   

## 2020-03-13 NOTE — Progress Notes (Signed)
  Speech Language Pathology Treatment: Dysphagia  Patient Details Name: Joe [name] MRN: [id number] DOB: Dec 30, 1926 Today's Date: 03/13/2020 Time: 4098-1191 SLP Time Calculation (min) (ACUTE ONLY): 24 min  Assessment / Plan / Recommendation Clinical Impression  Pt was seen for dysphagia treatment. He was alert and cooperative during the session and seated upright in the recliner. Pt's NT reported that the pt has not been swallowing dysphagia 2 solids and that he has reported that he "can't get it down". Coughing with larger boluses of nectar thick liquids was also reported. Pt demonstrated throat clearing and coughing with nectar thick liquids, honey thick liquids, and with puree solids. Frequency and severity of signs of aspiration were reduced, but not eliminated, with reduced boluses of individuals sips via cup and with 1/2 tsp boluses of purees. Dysphagia 2 solids were provided, but pt's bolus awareness was reduced and pt stated, "I can't get it down"; pt exhibited intermittent lingual manipulation of the bolus, but mastication was absent despite cueing. Pt's swallow function appears worse than yesterday despite reports from RN and MD and that he otherwise appears improved today clinically. Pt's case was discussed with Dr. Sloan Leiter and pt's RN, Joe [name], it was agreed that a modified barium swallow study would be conducted to further assess swallow function and to determine which diet would be least harmful with family's assumption of responsibility for some aspiration risk.    HPI HPI: Pt is a 87 y.o. male with medical history significant of hypertension, diabetes mellitus type II, and solitary kidney who presented with complaints of weakness with nausea and vomiting and was found to have found to have acute metabolic encephalopathy, acute hypoxic respiratory failure, DKA due to COVID-19 infection. CXR 2/5: Bilateral multifocal pneumonia. Palliative care consulted 2/9      SLP Plan  MBS        Recommendations  Diet recommendations: Dysphagia 1 (puree) (liquids TBD following MBS) Liquids provided via: Cup;No straw Medication Administration: Crushed with puree Supervision: Staff to assist with self feeding;Full supervision/cueing for compensatory strategies Compensations: Slow rate;Small sips/bites;Follow solids with liquid Postural Changes and/or Swallow Maneuvers: Seated upright 90 degrees;Upright 30-60 min after meal                Oral Care Recommendations: Oral care BID Follow up Recommendations: Skilled Nursing facility;24 hour supervision/assistance SLP Visit Diagnosis: Dysphagia, unspecified (R13.10) Plan: MBS       Joe [name] Joe [name], [place], Joe [name] Office number [phone] Pager [place] 03/13/2020, 1:21 PM

## 2020-03-14 DIAGNOSIS — N179 Acute kidney failure, unspecified: Secondary | ICD-10-CM | POA: Diagnosis not present

## 2020-03-14 DIAGNOSIS — U071 COVID-19: Secondary | ICD-10-CM | POA: Diagnosis not present

## 2020-03-14 DIAGNOSIS — J9601 Acute respiratory failure with hypoxia: Secondary | ICD-10-CM | POA: Diagnosis not present

## 2020-03-14 DIAGNOSIS — E1111 Type 2 diabetes mellitus with ketoacidosis with coma: Secondary | ICD-10-CM | POA: Diagnosis not present

## 2020-03-14 LAB — COMPREHENSIVE METABOLIC PANEL
ALT: 72 U/L — ABNORMAL HIGH (ref 0–44)
AST: 84 U/L — ABNORMAL HIGH (ref 15–41)
Albumin: 2.5 g/dL — ABNORMAL LOW (ref 3.5–5.0)
Alkaline Phosphatase: 78 U/L (ref 38–126)
Anion gap: 10 (ref 5–15)
BUN: 26 mg/dL — ABNORMAL HIGH (ref 8–23)
CO2: 25 mmol/L (ref 22–32)
Calcium: 8.3 mg/dL — ABNORMAL LOW (ref 8.9–10.3)
Chloride: 108 mmol/L (ref 98–111)
Creatinine, Ser: 1.03 mg/dL (ref 0.61–1.24)
GFR, Estimated: >60 mL/min (ref >60)
Glucose, Bld: 196 mg/dL — ABNORMAL HIGH (ref 70–99)
Potassium: 4 mmol/L (ref 3.5–5.1)
Sodium: 143 mmol/L (ref 135–145)
Total Bilirubin: 1.3 mg/dL — ABNORMAL HIGH (ref 0.3–1.2)
Total Protein: 4.8 g/dL — ABNORMAL LOW (ref 6.5–8.1)

## 2020-03-14 LAB — CBC
HCT: 39.9 % (ref 39.0–52.0)
Hemoglobin: 13.4 g/dL (ref 13.0–17.0)
MCH: 31.8 pg (ref 26.0–34.0)
MCHC: 33.6 g/dL (ref 30.0–36.0)
MCV: 94.8 fL (ref 80.0–100.0)
Platelets: 199 10*3/uL (ref 150–400)
RBC: 4.21 MIL/uL — ABNORMAL LOW (ref 4.22–5.81)
RDW: 15.1 % (ref 11.5–15.5)
WBC: 4.4 10*3/uL (ref 4.0–10.5)
nRBC: 0 % (ref 0.0–0.2)

## 2020-03-14 LAB — GLUCOSE, CAPILLARY
Glucose-Capillary: 169 mg/dL — ABNORMAL HIGH (ref 70–99)
Glucose-Capillary: 189 mg/dL — ABNORMAL HIGH (ref 70–99)
Glucose-Capillary: 205 mg/dL — ABNORMAL HIGH (ref 70–99)
Glucose-Capillary: 214 mg/dL — ABNORMAL HIGH (ref 70–99)

## 2020-03-14 MED ORDER — PHENOL 1.4 % MT LIQD
1.0000 | OROMUCOSAL | Status: DC | PRN
  Filled 2020-03-14: qty 177

## 2020-03-14 MED ORDER — INSULIN ASPART 100 UNIT/ML ~~LOC~~ SOLN
3.0000 [IU] | Freq: Once | SUBCUTANEOUS | Status: AC
  Administered 2020-03-14: 3 [IU] via SUBCUTANEOUS

## 2020-03-14 NOTE — Progress Notes (Signed)
PROGRESS NOTE                                                                                                                                                                                                             Patient Demographics:    Joe [name], is a 87 y.o. male, DOB - 23-Nov-1926, TMA:[id number]  Outpatient Primary MD for the patient is Patient, No Pcp Per   Admit date - 03/06/2020   LOS - 8  Chief Complaint  Patient presents with  . Emesis       Brief Narrative: Patient is a 87 y.o. male with PMHx of HTN, DM-2, solitary kidney-presenting with confusion/nausea and vomiting-found to have acute metabolic encephalopathy, acute hypoxic respiratory failure (6 L HFNC), DKA due to COVID-19 infection.  Patient was started on IV insulin/steroids/Remdesivir-given 1 dose of Actemra-clinically improved-further hospital course now complicated by delirium/poor oral intake and failure to thrive syndrome.   COVID-19 vaccinated status:   Significant Events: 2/5>> Admit to [facility] for sepsis/hypoxia (on 6 L of HFNC)-due to COVID-19, AKI and DKA  Significant studies: 2/4>> chest x-ray: Left base infiltrate 2/5>>Chest x-ray: Bilateral multifocal pneumonia 2/5>> bilateral lower extremity Doppler: No DVT.  COVID-19 medications: Steroids: 2/4>>2/10 Remdesivir: 2/4>> 2/8 Actemra: X1 on 2/4  Antibiotics: Zithromax: 2/4>>2/6 Rocephin: 2/4>> 2/8 Cefepime: 2/4 x 1 Flagyl: 2/4 x 1 Vancomycin: 2/4 x 1  Microbiology data: 2/4 >>blood culture: No growth 2/4>> urine culture: No growth  Procedures: None  Consults: Palliative care  DVT prophylaxis: enoxaparin (LOVENOX) injection 40 mg Start: 03/06/20 0900    Subjective:   Sleepy-but awakes-answers appropriately to simple questions.  Moving all 4 extremities.   Assessment  & Plan :   Acute Hypoxic Resp Failure due to Covid 19 Viral pneumonia +/-bacterial pneumonia:  Remains stable-on 2-3 L of oxygen this morning-has completed a course of steroids/antimicrobial therapy.  He continues to  encephalopathy-and remains at risk for aspiration.  See goals of care discussion below    Fever: Afebrile. O2 requirements:  SpO2: 95 % O2 Flow Rate (L/min): 4 L/min   COVID-19 Labs: Recent Labs    03/12/20 0046  DDIMER 3.70*  CRP 1.0*       Component Value Date/Time   BNP 87.3 03/06/2020 0820    Recent Labs  Lab 03/08/20 0203 03/09/20 0433  PROCALCITON 3.75 2.15  Lab Results  Component Value Date   SARSCOV2NAA POSITIVE (A) 03/06/2020     Prone/Incentive Spirometry: Encouraged incentive spirometry use-but confused-will not follow commands.  Elevated D-dimer: Likely due to COVID-19 related inflammation-lower extremity Dopplers negative-with improving hypoxemia-doubt further work-up is required.  On prophylactic Lovenox.  Sepsis due to COVID-19 pneumonia +/-bacterial pneumonia: Sepsis physiology has resolved-cultures negative so far-has completed a course of antimicrobial therapy  Acute metabolic encephalopathy: Multifactorial-from sepsis/hypoxemia/DKA/steroid use-has significant hearing impairment at baseline-probably has some amount of cognitive dysfunction as well.  Continues to have confusion-but seems to wax and wane in severity at times.  He remains on Seroquel-suspect he will continue to have AMS/delirium as long as hospitalized.  Dysphagia: Due to severe deconditioning-probably has some amount of dysphagia at baseline-SLP following-with plans for possible MBS later today.  Family accepting all risk of aspiration-no plans to start tube feeds at all.  DKA: Overall improved-transitioned off insulin infusion-on SQ insulin  Acute kidney injury: Likely hemodynamically mediated-has resolved.  Has congenital solitary kidney.   Hypernatremia: Resolved with hypotonic saline.  Secondary to poor oral intake.  Hypomagnesemia: Repleted  HTN: BP  stable-continue metoprolol and amlodipine.  Reassess in the next 1-2 days for further adjustment.    Insulin-dependent DM-2 (A1c 8.7 on 2/4): CBGs stable-poor oral intake-at risk for hypoglycemia-continue SSI.  Not a candidate for aggressive glycemic control.  Allow permissive hyperglycemia.     Recent Labs    03/13/20 2131 03/14/20 0726 03/14/20 1213  GLUCAP 228* 169* 189*   Deconditioning/debility failure to thrive syndrome: Deconditioned-much more weaker than usual baseline-appetite continues to wax and wane as well-did not eat much yesterday-but had a significant amount of food the day before.  PT/OT/palliative care following-tentative plans for SNF early next week.    Goals of care: Is for gentle medical treatment-supportive care-encourage oral intake-no longer on IV fluids-if he is able to sustain oral intake with normal electrolytes-stable oxygenation-we will plan on home with hospice or home with home health (daughter on 2/12 not wishing to pursue SNF)-sometime EARLY NEXT WEEK.  However if he deteriorates/electrolytes worsen-he may be appropriate to transition to comfort measures.  Would not restart IVF for hypernatremia if it develops again.  Daughter/family agreeable with this plan.  Hard of hearing  Obesity: Estimated body mass index is 30.85 kg/m as calculated from the following:   Height as of this encounter: 5\' 10"  (1.778 m).   Weight as of this encounter: 97.5 kg.   GI prophylaxis: H2 Blocker  ABG:    Component Value Date/Time   HCO3 16.6 (L) 03/06/2020 0659   TCO2 18 (L) 03/06/2020 0659   ACIDBASEDEF 9.0 (H) 03/06/2020 0659   O2SAT 100.0 03/06/2020 0659    Vent Settings: N/A    Condition - Extremely Guarded  Family Communication  :  Daughter-[phone] left a voicemail on 2/12  Code Status :  DNR  Diet :  Diet Order            DIET - DYS 1 Room service appropriate? Yes with Assist; Fluid consistency: Nectar Thick  Diet effective now                   Disposition Plan  :   Status is: Inpatient Remains inpatient appropriate because:Inpatient level of care appropriate due to severity of illness   Dispo: The patient is from: Home              Anticipated d/c is to: TBD  Anticipated d/c date is: > 3 days              Patient currently is not medically stable to d/c.   Difficult to place patient No   Barriers to discharge: Hypoxia requiring O2 supplementation/complete 5 days of IV Remdesivir  Antimicorbials  :    Anti-infectives (From admission, onward)   Start     Dose/Rate Route Frequency Ordered Stop   03/07/20 1000  remdesivir 100 mg in sodium chloride 0.9 % 100 mL IVPB       "Followed by" Linked Group Details   100 mg 200 mL/hr over 30 Minutes Intravenous Daily 03/06/20 0555 03/11/20 0804   03/06/20 1600  cefTRIAXone (ROCEPHIN) 1 g in sodium chloride 0.9 % 100 mL IVPB        1 g 200 mL/hr over 30 Minutes Intravenous Every 24 hours 03/06/20 0810 03/10/20 2135   03/06/20 1400  azithromycin (ZITHROMAX) 500 mg in sodium chloride 0.9 % 250 mL IVPB  Status:  Discontinued        500 mg 250 mL/hr over 60 Minutes Intravenous Every 24 hours 03/06/20 0810 03/06/20 0837   03/06/20 0900  azithromycin (ZITHROMAX) 500 mg in sodium chloride 0.9 % 250 mL IVPB  Status:  Discontinued        500 mg 250 mL/hr over 60 Minutes Intravenous Every 24 hours 03/06/20 0837 03/09/20 1052   03/06/20 0700  remdesivir 200 mg in sodium chloride 0.9% 250 mL IVPB       "Followed by" Linked Group Details   200 mg 580 mL/hr over 30 Minutes Intravenous Once 03/06/20 0555 03/06/20 0835   03/06/20 0430  vancomycin (VANCOREADY) IVPB 2000 mg/400 mL        2,000 mg 200 mL/hr over 120 Minutes Intravenous  Once 03/06/20 0423 03/06/20 0713   03/06/20 0400  ceFEPIme (MAXIPIME) 2 g in sodium chloride 0.9 % 100 mL IVPB        2 g 200 mL/hr over 30 Minutes Intravenous  Once 03/06/20 0358 03/06/20 0452   03/06/20 0400  metroNIDAZOLE (FLAGYL) IVPB 500  mg        500 mg 100 mL/hr over 60 Minutes Intravenous  Once 03/06/20 0358 03/06/20 0622   03/06/20 0400  vancomycin (VANCOCIN) IVPB 1000 mg/200 mL premix  Status:  Discontinued        1,000 mg 200 mL/hr over 60 Minutes Intravenous  Once 03/06/20 0358 03/06/20 0423      Inpatient Medications  Scheduled Meds: . albuterol  2 puff Inhalation Q6H  . amLODipine  10 mg Oral Daily  . vitamin C  500 mg Oral Daily  . aspirin EC  81 mg Oral Daily  . Chlorhexidine Gluconate Cloth  6 each Topical Daily  . enoxaparin (LOVENOX) injection  40 mg Subcutaneous Q24H  . famotidine  20 mg Oral BID  . insulin aspart  0-15 Units Subcutaneous TID WC  . metoprolol succinate  75 mg Oral Daily  . QUEtiapine  25 mg Oral QHS  . sodium chloride flush  3 mL Intravenous Q12H  . zinc sulfate  220 mg Oral Daily   Continuous Infusions: . sodium chloride 10 mL/hr at 03/13/20 1500   PRN Meds:.acetaminophen, acetaminophen, dextrose, guaiFENesin-dextromethorphan, haloperidol lactate, hydrALAZINE, melatonin, ondansetron **OR** ondansetron (ZOFRAN) IV, phenol, Resource ThickenUp Clear, sodium chloride flush   Time Spent in minutes  25    See all Orders from today for further details   Oren Binet M.D on 03/14/2020 at 3:22 PM  To page go to [email or web address] - use universal password  [facility] -  Office  [phone]    Objective:   Vitals:   03/13/20 1203 03/13/20 1937 03/14/20 0539 03/14/20 1413  BP: (!) 117/56 (!) 145/79 138/70 133/65  Pulse: 71 76 75 74  Resp: 18 19 16 15   Temp: (!) 97.5 F (36.4 C) 98.3 F (36.8 C) 98.5 F (36.9 C) 98.2 F (36.8 C)  TempSrc: Axillary Axillary Axillary Oral  SpO2: 92% 94% 90% 95%  Weight:      Height:        Wt Readings from Last 3 Encounters:  03/06/20 97.5 kg     Intake/Output Summary (Last 24 hours) at 03/14/2020 1522 Last data filed at 03/14/2020 1300 Gross per 24 hour  Intake 110 ml  Output 500 ml  Net -390 ml     Physical Exam Gen  Exam: Confused-but follows commands-not in any distress. HEENT:atraumatic, normocephalic Chest: B/L clear to auscultation anteriorly CVS:S1S2 regular Abdomen:soft non tender, non distended Extremities:no edema Neurology: Generalized weakness-but nonfocal exam Skin: no rash   Data Review:    CBC Recent Labs  Lab 03/08/20 0203 03/09/20 0433 03/10/20 0500 03/11/20 1005 03/12/20 0046 03/14/20 0749  WBC 8.9 9.8 4.6 5.8 5.3 4.4  HGB 13.9 14.3 13.4 13.8 13.1 13.4  HCT 41.4 42.1 38.9* 39.1 38.4* 39.9  PLT 284 305 265 277 247 199  MCV 92.2 92.9 92.4 91.8 93.2 94.8  MCH 31.0 31.6 31.8 32.4 31.8 31.8  MCHC 33.6 34.0 34.4 35.3 34.1 33.6  RDW 14.6 14.8 14.7 14.7 15.1 15.1  LYMPHSABS 0.3* 0.6* 0.6* 0.8 0.8  --   MONOABS 0.3 0.4 0.8 1.1* 0.9  --   EOSABS 0.0 0.0 0.0 0.2 0.2  --   BASOSABS 0.0 0.0 0.0 0.0 0.0  --     Chemistries  Recent Labs  Lab 03/08/20 0203 03/09/20 0433 03/10/20 0500 03/11/20 0547 03/12/20 0046 03/13/20 0200 03/14/20 0749  NA 139 142 143 143 147* 141 143  K 4.4 4.3 3.7 5.8* 4.4 4.1 4.0  CL 107 108 107 110 110 109 108  CO2 16* 21* 22 21* 25 24 25   GLUCOSE 215* 196* 96 95 95 194* 196*  BUN 36* 40* 41* 35* 32* 31* 26*  CREATININE 1.36* 1.30* 1.10 1.08 1.24 1.26* 1.03  CALCIUM 8.1* 8.2* 8.1* 8.3* 8.6* 8.2* 8.3*  MG 1.9 2.2 1.8 2.0 1.9  --   --   AST 138* 103* 90* 132* 123*  --  84*  ALT 50* 53* 52* 54* 63*  --  72*  ALKPHOS 74 82 75 75 80  --  78  BILITOT 0.8 0.6 0.8 2.0* 1.0  --  1.3*   ------------------------------------------------------------------------------------------------------------------ No results for input(s): CHOL, HDL, LDLCALC, TRIG, CHOLHDL, LDLDIRECT in the last 72 hours.  Lab Results  Component Value Date   HGBA1C 8.7 (H) 03/06/2020   ------------------------------------------------------------------------------------------------------------------ No results for input(s): TSH, T4TOTAL, T3FREE, THYROIDAB in the last 72  hours.  Invalid input(s): FREET3 ------------------------------------------------------------------------------------------------------------------ No results for input(s): VITAMINB12, FOLATE, FERRITIN, TIBC, IRON, RETICCTPCT in the last 72 hours.  Coagulation profile No results for input(s): INR, PROTIME in the last 168 hours.  Recent Labs    03/12/20 0046  DDIMER 3.70*    Cardiac Enzymes No results for input(s): CKMB, TROPONINI, MYOGLOBIN in the last 168 hours.  Invalid input(s): CK ------------------------------------------------------------------------------------------------------------------    Component Value Date/Time   BNP 87.3 03/06/2020 0820    Micro Results Recent Results (from the  past 240 hour(s))  SARS Coronavirus 2 by RT PCR (hospital order, performed in [facility] hospital lab) Nasopharyngeal Nasopharyngeal Swab     Status: Abnormal   Collection Time: 03/06/20  3:58 AM   Specimen: Nasopharyngeal Swab  Result Value Ref Range Status   SARS Coronavirus 2 POSITIVE (A) NEGATIVE Final    Comment: RESULT CALLED TO, READ BACK BY AND VERIFIED WITH: K GIBSON RN 03/06/20 0538 JDW (NOTE) SARS-CoV-2 target nucleic acids are DETECTED  SARS-CoV-2 RNA is generally detectable in upper respiratory specimens  during the acute phase of infection.  Positive results are indicative  of the presence of the identified virus, but do not rule out bacterial infection or co-infection with other pathogens not detected by the test.  Clinical correlation with patient history and  other diagnostic information is necessary to determine patient infection status.  The expected result is negative.  Fact Sheet for Patients:   StrictlyIdeas.no   Fact Sheet for Healthcare Providers:   BankingDealers.co.za    This test is not yet approved or cleared by the [place] FDA and  has been authorized for detection and/or diagnosis of SARS-CoV-2  by FDA under an Emergency Use Authorization (EUA).  This EUA will remain in effect (meaning this test can  be used) for the duration of  the COVID-19 declaration under Section 564(b)(1) of the Act, 21 U.S.C. section 360-bbb-3(b)(1), unless the authorization is terminated or revoked sooner.  Performed at [facility], [place] [address] [place], [place] [address]   Blood Culture (routine x 2)     Status: None   Collection Time: 03/06/20  3:58 AM   Specimen: BLOOD RIGHT WRIST  Result Value Ref Range Status   Specimen Description BLOOD RIGHT WRIST  Final   Special Requests   Final    BOTTLES DRAWN AEROBIC AND ANAEROBIC Blood Culture results may not be optimal due to an inadequate volume of blood received in culture bottles   Culture   Final    NO GROWTH 5 DAYS Performed at [facility], [place] [address] [place], [place] [address]    Report Status 03/11/2020 FINAL  Final  Urine culture     Status: None   Collection Time: 03/06/20  3:58 AM   Specimen: In/Out Cath Urine  Result Value Ref Range Status   Specimen Description IN/OUT CATH URINE  Final   Special Requests NONE  Final   Culture   Final    NO GROWTH Performed at [facility], [place] [address] [place], [place] [address]    Report Status 03/07/2020 FINAL  Final  Blood Culture (routine x 2)     Status: None   Collection Time: 03/06/20  4:20 AM   Specimen: BLOOD RIGHT HAND  Result Value Ref Range Status   Specimen Description BLOOD RIGHT HAND  Final   Special Requests   Final    BOTTLES DRAWN AEROBIC AND ANAEROBIC Blood Culture results may not be optimal due to an inadequate volume of blood received in culture bottles   Culture   Final    NO GROWTH 5 DAYS Performed at [facility], [place] [address], [place], [place] [address]    Report Status 03/11/2020 FINAL  Final    Radiology Reports DG Chest Port 1 View  Result Date: 03/06/2020 CLINICAL DATA:  Questionable sepsis EXAM: PORTABLE CHEST 1 VIEW  COMPARISON:  10/30/2006 FINDINGS: Infiltrate at the left lung base. Accentuation of markings at the right base which  may be atelectasis in the setting of low lung volumes. Cardiomegaly which is stable. No pulmonary edema, effusion, or pneumothorax. IMPRESSION: Low volume chest with infiltrate at the left base. Electronically Signed   By: Monte Fantasia M.D.   On: 03/06/2020 04:13   DG Chest Port 1V same Day  Result Date: 03/07/2020 CLINICAL DATA:  Weakness. EXAM: PORTABLE CHEST 1 VIEW COMPARISON:  March 06, 2020. FINDINGS: Stable cardiomediastinal silhouette. Patchy airspace opacities are noted bilaterally consistent with multifocal pneumonia. No pneumothorax pleural effusion is noted. Bony thorax is unremarkable. IMPRESSION: Bilateral multifocal pneumonia. Electronically Signed   By: Marijo Conception M.D.   On: 03/07/2020 09:24   DG Swallowing Func-Speech Pathology  Result Date: 03/13/2020 Objective Swallowing Evaluation: Type of Study: MBS-Modified Barium Swallow Study  Patient Details Name: Joe [name] MRN: [id number] Date of Birth: 08/08/1926 Today's Date: 03/13/2020 Time: SLP Start Time (ACUTE ONLY): 1410 -SLP Stop Time (ACUTE ONLY): 1430 SLP Time Calculation (min) (ACUTE ONLY): 20 min Past Medical History: Past Medical History: Diagnosis Date . Diabetes mellitus without complication ([place])  . Hypertension  . Solitary kidney, congenital  Past Surgical History: No past surgical history on file. HPI: Pt is a 87 y.o. male with medical history significant of hypertension, diabetes mellitus type II, and solitary kidney who presented with complaints of weakness with nausea and vomiting and was found to have found to have acute metabolic encephalopathy, acute hypoxic respiratory failure, DKA due to COVID-19 infection. CXR 2/5: Bilateral multifocal pneumonia. Palliative care consulted 2/9  No data recorded Assessment / Plan / Recommendation CHL IP CLINICAL IMPRESSIONS 03/13/2020 Clinical Impression Pt presents  with oropharyngeal dysphagia characterized by reduced posterior bolus propulsion, prolonged and inconsistent mastication, reduced bolus cohesion, a pharyngeal delay, reduced lingual retraction, reduced pharyngeal constriction, reduced hyolaryngeal elevation and reduced anterior laryngeal movement. He demonstrated premature spillage to the pyriform sinuses, base of tongue residue, mild vallecular residue, posterior pharyngeal residue, pyriform sinus residue, and inconsistent incomplete epiglottic movement. Pt's swallow was often triggered with at least the head of the liquid bolus at the level of the pyriform sinuses with liquids. This resulted in penetration (PAS 5) and aspiration (PAS 7) of nectar thick liquids via straw and of thin liquids via cup/straw. Aspiration resulted in throat clearing and an occasional cough which was ineffective in expelling the aspirate. Pt was unable to demonstrate a cough despite prompts due to difficulty following commands. Aspirated material was mobilized superior to the vocal folds with throat clearing and exhilation, but subsequently aspirated again. Per conversation with Dr. Nena Alexander, pt's family would like the pt to remain on a p.o. diet at this time with known aspiration risk. A dysphagia 1 (puree) diet with nectar thick liquids is recommended at this time with strict observance of the swallowing precautions listed below to help mitigate aspiration risk as well as the risk of pulmonary-related complications. Pt is still at risk of aspiration with this diet, but honey thick liquids poses a more significant risk due to pharyngeal residue. SLP will continue to follow pt briefly. SLP Visit Diagnosis Dysphagia, oropharyngeal phase (R13.12) Attention and concentration deficit following -- Frontal lobe and executive function deficit following -- Impact on safety and function Moderate aspiration risk   CHL IP TREATMENT RECOMMENDATION 03/13/2020 Treatment Recommendations Therapy as  outlined in treatment plan below   Prognosis 03/13/2020 Prognosis for Safe Diet Advancement Guarded Barriers to Reach Goals Severity of deficits;Time post onset Barriers/Prognosis Comment -- CHL IP DIET RECOMMENDATION 03/13/2020 SLP Diet Recommendations  Dysphagia 1 (Puree) solids;Nectar thick liquid Liquid Administration via Cup;No straw Medication Administration Crushed with puree Compensations Slow rate;Small sips/bites;Follow solids with liquid Postural Changes Remain semi-upright after after feeds/meals (Comment)   CHL IP OTHER RECOMMENDATIONS 03/13/2020 Recommended Consults -- Oral Care Recommendations Oral care BID Other Recommendations Order thickener from pharmacy   CHL IP FOLLOW UP RECOMMENDATIONS 03/13/2020 Follow up Recommendations Skilled Nursing facility;24 hour supervision/assistance   CHL IP FREQUENCY AND DURATION 03/13/2020 Speech Therapy Frequency (ACUTE ONLY) min 2x/week Treatment Duration 2 weeks      CHL IP ORAL PHASE 03/13/2020 Oral Phase Impaired Oral - Pudding Teaspoon -- Oral - Pudding Cup -- Oral - Honey Teaspoon Reduced posterior propulsion;Weak lingual manipulation;Lingual/palatal residue Oral - Honey Cup Reduced posterior propulsion;Weak lingual manipulation;Lingual/palatal residue Oral - Nectar Teaspoon NT Oral - Nectar Cup Reduced posterior propulsion;Weak lingual manipulation;Lingual/palatal residue Oral - Nectar Straw Reduced posterior propulsion;Weak lingual manipulation;Lingual/palatal residue Oral - Thin Teaspoon -- Oral - Thin Cup Reduced posterior propulsion;Weak lingual manipulation;Lingual/palatal residue Oral - Thin Straw Reduced posterior propulsion;Weak lingual manipulation;Lingual/palatal residue Oral - Puree Reduced posterior propulsion;Weak lingual manipulation;Lingual/palatal residue Oral - Mech Soft Reduced posterior propulsion;Weak lingual manipulation;Lingual/palatal residue;Impaired mastication Oral - Regular -- Oral - Multi-Consistency -- Oral - Pill -- Oral Phase -  Comment --  CHL IP PHARYNGEAL PHASE 03/13/2020 Pharyngeal Phase Impaired Pharyngeal- Pudding Teaspoon -- Pharyngeal -- Pharyngeal- Pudding Cup -- Pharyngeal -- Pharyngeal- Honey Teaspoon Reduced tongue base retraction;Pharyngeal residue - valleculae;Pharyngeal residue - pyriform;Reduced anterior laryngeal mobility;Reduced epiglottic inversion;Reduced laryngeal elevation;Reduced airway/laryngeal closure;Delayed swallow initiation-pyriform sinuses;Reduced pharyngeal peristalsis Pharyngeal -- Pharyngeal- Honey Cup Reduced tongue base retraction;Pharyngeal residue - valleculae;Pharyngeal residue - pyriform;Reduced anterior laryngeal mobility;Reduced epiglottic inversion;Reduced laryngeal elevation;Reduced airway/laryngeal closure;Delayed swallow initiation-pyriform sinuses;Reduced pharyngeal peristalsis Pharyngeal -- Pharyngeal- Nectar Teaspoon -- Pharyngeal -- Pharyngeal- Nectar Cup Reduced tongue base retraction;Pharyngeal residue - valleculae;Pharyngeal residue - pyriform;Reduced anterior laryngeal mobility;Reduced epiglottic inversion;Reduced laryngeal elevation;Reduced airway/laryngeal closure;Delayed swallow initiation-pyriform sinuses;Reduced pharyngeal peristalsis Pharyngeal -- Pharyngeal- Nectar Straw Reduced tongue base retraction;Pharyngeal residue - valleculae;Pharyngeal residue - pyriform;Reduced anterior laryngeal mobility;Reduced epiglottic inversion;Reduced laryngeal elevation;Reduced airway/laryngeal closure;Delayed swallow initiation-pyriform sinuses;Reduced pharyngeal peristalsis;Penetration/Aspiration during swallow;Penetration/Apiration after swallow Pharyngeal Material enters airway, passes BELOW cords and not ejected out despite cough attempt by patient;Material enters airway, CONTACTS cords and not ejected out Pharyngeal- Thin Teaspoon -- Pharyngeal -- Pharyngeal- Thin Cup Reduced tongue base retraction;Pharyngeal residue - valleculae;Pharyngeal residue - pyriform;Reduced anterior laryngeal  mobility;Reduced epiglottic inversion;Reduced laryngeal elevation;Reduced airway/laryngeal closure;Delayed swallow initiation-pyriform sinuses;Reduced pharyngeal peristalsis;Penetration/Aspiration during swallow;Penetration/Apiration after swallow Pharyngeal Material enters airway, passes BELOW cords and not ejected out despite cough attempt by patient Pharyngeal- Thin Straw Reduced tongue base retraction;Pharyngeal residue - valleculae;Pharyngeal residue - pyriform;Reduced anterior laryngeal mobility;Reduced epiglottic inversion;Reduced laryngeal elevation;Reduced airway/laryngeal closure;Delayed swallow initiation-pyriform sinuses;Reduced pharyngeal peristalsis;Penetration/Aspiration during swallow;Penetration/Apiration after swallow Pharyngeal Material enters airway, passes BELOW cords and not ejected out despite cough attempt by patient Pharyngeal- Puree Reduced tongue base retraction;Pharyngeal residue - valleculae;Pharyngeal residue - pyriform;Reduced anterior laryngeal mobility;Reduced epiglottic inversion;Reduced laryngeal elevation;Reduced airway/laryngeal closure;Delayed swallow initiation-pyriform sinuses;Reduced pharyngeal peristalsis Pharyngeal -- Pharyngeal- Mechanical Soft Reduced tongue base retraction;Pharyngeal residue - valleculae;Pharyngeal residue - pyriform;Reduced anterior laryngeal mobility;Reduced epiglottic inversion;Reduced laryngeal elevation;Reduced airway/laryngeal closure;Delayed swallow initiation-pyriform sinuses;Reduced pharyngeal peristalsis Pharyngeal -- Pharyngeal- Regular -- Pharyngeal -- Pharyngeal- Multi-consistency -- Pharyngeal -- Pharyngeal- Pill -- Pharyngeal -- Pharyngeal Comment --  No flowsheet data found. Shanika I. Hardin Negus, [place], [place] Office number [phone] Pager [place] 03/13/2020, 4:12 PM  VAS [place] LOWER EXTREMITY VENOUS (DVT)  Result Date: 03/08/2020  Lower Venous DVT Study Indications:  Covid-19, elevated D-Dimer.  Limitations: Patient with altered mental status and constant movement. Comparison Study: No prior study on file Performing Technologist: Sharion Dove RVS  Examination Guidelines: A complete evaluation includes B-mode imaging, spectral Doppler, color Doppler, and power Doppler as needed of all accessible portions of each vessel. Bilateral testing is considered an integral part of a complete examination. Limited examinations for reoccurring indications may be performed as noted. The reflux portion of the exam is performed with the patient in reverse Trendelenburg.  +---------+---------------+---------+-----------+----------+-------------------+ RIGHT    CompressibilityPhasicitySpontaneityPropertiesThrombus Aging      +---------+---------------+---------+-----------+----------+-------------------+ CFV      Full           Yes      Yes                                      +---------+---------------+---------+-----------+----------+-------------------+ SFJ      Full                                                             +---------+---------------+---------+-----------+----------+-------------------+ FV Prox  Full                                                             +---------+---------------+---------+-----------+----------+-------------------+ FV Mid   Full                                                             +---------+---------------+---------+-----------+----------+-------------------+ FV DistalFull                                                             +---------+---------------+---------+-----------+----------+-------------------+ PFV      Full                                                             +---------+---------------+---------+-----------+----------+-------------------+ POP      Full           Yes      Yes                                       +---------+---------------+---------+-----------+----------+-------------------+ PTV      Full                                                             +---------+---------------+---------+-----------+----------+-------------------+  PERO                                                  Not well visualized +---------+---------------+---------+-----------+----------+-------------------+   +---------+---------------+---------+-----------+----------+-------------------+ LEFT     CompressibilityPhasicitySpontaneityPropertiesThrombus Aging      +---------+---------------+---------+-----------+----------+-------------------+ CFV      Full           Yes      Yes                                      +---------+---------------+---------+-----------+----------+-------------------+ SFJ      Full                                                             +---------+---------------+---------+-----------+----------+-------------------+ FV Prox  Full           Yes      Yes                                      +---------+---------------+---------+-----------+----------+-------------------+ FV Mid   Full                                                             +---------+---------------+---------+-----------+----------+-------------------+ FV DistalFull                                                             +---------+---------------+---------+-----------+----------+-------------------+ PFV      Full                                                             +---------+---------------+---------+-----------+----------+-------------------+ POP      Full           Yes      Yes                                      +---------+---------------+---------+-----------+----------+-------------------+ PTV      Full                                                             +---------+---------------+---------+-----------+----------+-------------------+  PERO  Not well visualized +---------+---------------+---------+-----------+----------+-------------------+    Summary: RIGHT: - There is no evidence of deep vein thrombosis in the lower extremity. However, portions of this examination were limited- see technologist comments above.  LEFT: - There is no evidence of deep vein thrombosis in the lower extremity. However, portions of this examination were limited- see technologist comments above.  *See table(s) above for measurements and observations. Electronically signed by Monica Martinez MD on 03/08/2020 at 2:27:10 PM.    Final    [place] EKG SITE RITE  Result Date: 03/08/2020 If Site Rite image not attached, placement could not be confirmed due to current cardiac rhythm.

## 2020-03-14 NOTE — TOC Progression Note (Signed)
Transition of Care [facility]) - Progression Note    Patient Details  Name: Joe [name] MRN: [id number] Date of Birth: Apr 15, 1926  [facility]) CM/SW Contact  Carles Collet, RN Phone Number: 03/14/2020, 5:00 PM  Clinical Narrative:   Discussed differences between home health, home hospice, SNF with palliative and SNF with hospice to daughter [name].  Discussed level of support needed as documented by PT OT. Patient lives with [name], but she works full time. She has family across the street, and is in the process of speaking with them to see if they can put together 24 hour supervision with the level of care that he requires. She expressed need for hospital bed if he were to return home. Once determined if he is going home and wether or it will be through home health or home hospice, arrangements for DME can begin. I encouraged [name] to begin preparing a space for the DME regardless as DC is expected Monday or Tuesday.  MD updated.      Expected Discharge Plan: Skilled Nursing Facility Barriers to Discharge: SNF Covid,SNF Covid Recovering  Expected Discharge Plan and Services Expected Discharge Plan: [place] In-house Referral: NA Discharge Planning Services: NA Post Acute Care Choice: [place] Living arrangements for the past 2 months: Single Family Home                 DME Arranged: N/A DME Agency: NA       HH Arranged: NA HH Agency: NA         Social Determinants of Health (SDOH) Interventions    Readmission Risk Interventions No flowsheet data found.

## 2020-03-15 DIAGNOSIS — J1282 Pneumonia due to coronavirus disease 2019: Secondary | ICD-10-CM | POA: Diagnosis not present

## 2020-03-15 DIAGNOSIS — U071 COVID-19: Secondary | ICD-10-CM | POA: Diagnosis not present

## 2020-03-15 LAB — COMPREHENSIVE METABOLIC PANEL
ALT: 71 U/L — ABNORMAL HIGH (ref 0–44)
AST: 79 U/L — ABNORMAL HIGH (ref 15–41)
Albumin: 2.5 g/dL — ABNORMAL LOW (ref 3.5–5.0)
Alkaline Phosphatase: 103 U/L (ref 38–126)
Anion gap: 11 (ref 5–15)
BUN: 29 mg/dL — ABNORMAL HIGH (ref 8–23)
CO2: 26 mmol/L (ref 22–32)
Calcium: 8.4 mg/dL — ABNORMAL LOW (ref 8.9–10.3)
Chloride: 106 mmol/L (ref 98–111)
Creatinine, Ser: 1.2 mg/dL (ref 0.61–1.24)
GFR, Estimated: 57 mL/min — ABNORMAL LOW (ref >60)
Glucose, Bld: 192 mg/dL — ABNORMAL HIGH (ref 70–99)
Potassium: 4.3 mmol/L (ref 3.5–5.1)
Sodium: 143 mmol/L (ref 135–145)
Total Bilirubin: 1.1 mg/dL (ref 0.3–1.2)
Total Protein: 5 g/dL — ABNORMAL LOW (ref 6.5–8.1)

## 2020-03-15 LAB — CBC
HCT: 43 % (ref 39.0–52.0)
Hemoglobin: 13.9 g/dL (ref 13.0–17.0)
MCH: 31.2 pg (ref 26.0–34.0)
MCHC: 32.3 g/dL (ref 30.0–36.0)
MCV: 96.6 fL (ref 80.0–100.0)
Platelets: 219 10*3/uL (ref 150–400)
RBC: 4.45 MIL/uL (ref 4.22–5.81)
RDW: 15.3 % (ref 11.5–15.5)
WBC: 4.4 10*3/uL (ref 4.0–10.5)
nRBC: 0 % (ref 0.0–0.2)

## 2020-03-15 LAB — D-DIMER, QUANTITATIVE: D-Dimer, Quant: 3.22 ug/mL-FEU — ABNORMAL HIGH (ref 0.00–0.50)

## 2020-03-15 LAB — GLUCOSE, CAPILLARY
Glucose-Capillary: 198 mg/dL — ABNORMAL HIGH (ref 70–99)
Glucose-Capillary: 208 mg/dL — ABNORMAL HIGH (ref 70–99)
Glucose-Capillary: 234 mg/dL — ABNORMAL HIGH (ref 70–99)
Glucose-Capillary: 199 mg/dL — ABNORMAL HIGH (ref 70–99)

## 2020-03-15 LAB — C-REACTIVE PROTEIN: CRP: 0.6 mg/dL (ref <1.0)

## 2020-03-15 MED ORDER — ENOXAPARIN SODIUM 40 MG/0.4ML ~~LOC~~ SOLN
40.0000 mg | Freq: Two times a day (BID) | SUBCUTANEOUS | Status: DC
  Administered 2020-03-15 – 2020-03-17 (×5): 40 mg via SUBCUTANEOUS
  Filled 2020-03-15 (×5): qty 0.4

## 2020-03-15 MED ORDER — TAMSULOSIN HCL 0.4 MG PO CAPS
0.4000 mg | ORAL_CAPSULE | Freq: Every day | ORAL | Status: DC
  Administered 2020-03-16 – 2020-03-17 (×2): 0.4 mg via ORAL
  Filled 2020-03-15 (×3): qty 1

## 2020-03-15 NOTE — Progress Notes (Addendum)
PROGRESS NOTE                                                                                                                                                                                                             Patient Demographics:    Joe [name], is a 87 y.o. male, DOB - 10-11-1926, UKG:[id number]  Outpatient Primary MD for the patient is Patient, No Pcp Per   Admit date - 03/06/2020   LOS - 9  Chief Complaint  Patient presents with  . Emesis       Brief Narrative: Patient is a 87 y.o. male with PMHx of HTN, DM-2, solitary kidney-presenting with confusion/nausea and vomiting-found to have acute metabolic encephalopathy, acute hypoxic respiratory failure (6 L HFNC), DKA due to COVID-19 infection.  Patient was started on IV insulin/steroids/Remdesivir-given 1 dose of Actemra-clinically improved-further hospital course now complicated by delirium/poor oral intake and failure to thrive syndrome.   COVID-19 vaccinated status:   Significant Events: 2/5>> Admit to [facility] for sepsis/hypoxia (on 6 L of HFNC)-due to COVID-19, AKI and DKA  Significant studies: 2/4>> chest x-ray: Left base infiltrate 2/5>>Chest x-ray: Bilateral multifocal pneumonia 2/5>> bilateral lower extremity Doppler: No DVT.  COVID-19 medications: Steroids: 2/4>>2/10 Remdesivir: 2/4>> 2/8 Actemra: X1 on 2/4  Antibiotics: Zithromax: 2/4>>2/6 Rocephin: 2/4>> 2/8 Cefepime: 2/4 x 1 Flagyl: 2/4 x 1 Vancomycin: 2/4 x 1  Microbiology data: 2/4 >>blood culture: No growth 2/4>> urine culture: No growth  Procedures: None  Consults: Palliative care  DVT prophylaxis: enoxaparin (LOVENOX) injection 40 mg Start: 03/15/20 1000    Subjective:   Patient in bed, appears comfortable - confused, denies any headache, no fever, no chest pain or pressure, no shortness of breath , no abdominal pain. No focal weakness.    Assessment  & Plan :    Acute Hypoxic Resp Failure due to Covid 19 Viral pneumonia +/-bacterial pneumonia: Remains stable-on 2-3 L of oxygen this morning-has completed a course of steroids/antimicrobial therapy.  He continues to  encephalopathy-and remains at risk for aspiration.  See goals of care discussion below    Fever: Afebrile. O2 requirements:  SpO2: 94 % O2 Flow Rate (L/min): 4 L/min   COVID-19 Labs: Recent Labs    03/15/20 0300  DDIMER 3.22*  CRP 0.6       Component Value Date/Time   BNP 87.3  03/06/2020 0820    Recent Labs  Lab 03/09/20 0433  PROCALCITON 2.15    Lab Results  Component Value Date   SARSCOV2NAA POSITIVE (A) 03/06/2020     Prone/Incentive Spirometry: Encouraged incentive spirometry use-but confused-will not follow commands.  Elevated D-dimer: Likely due to COVID-19 related inflammation-lower extremity Dopplers negative-with improving hypoxemia-doubt further work-up is required.  On prophylactic Lovenox.  Sepsis due to COVID-19 pneumonia +/-bacterial pneumonia: Sepsis physiology has resolved-cultures negative so far-has completed a course of antimicrobial therapy  Acute metabolic encephalopathy: Multifactorial-from sepsis/hypoxemia/DKA/steroid use-has significant hearing impairment at baseline-probably has some amount of cognitive dysfunction as well.  Continues to have confusion-but seems to wax and wane in severity at times.  He remains on Seroquel-suspect he will continue to have AMS/delirium as long as hospitalized.  Dysphagia: Due to severe deconditioning-probably has some amount of dysphagia at baseline-SLP following-with plans for possible MBS later today.  Family accepting all risk of aspiration-no plans to start tube feeds at all.  DKA: Overall improved-transitioned off insulin infusion-on SQ insulin  Acute kidney injury: Likely hemodynamically mediated-has resolved.  Has congenital solitary kidney.   Hypernatremia: Resolved with hypotonic saline.  Secondary  to poor oral intake.  Hypomagnesemia: Repleted  HTN: BP stable-continue metoprolol and amlodipine.  Reassess in the next 1-2 days for further adjustment.    Urinary retention - Flomax, foley.  Insulin-dependent DM-2 (A1c 8.7 on 2/4): CBGs stable-poor oral intake-at risk for hypoglycemia-continue SSI.  Not a candidate for aggressive glycemic control.  Allow permissive hyperglycemia.     Recent Labs    03/14/20 1733 03/14/20 2056 03/15/20 0726  GLUCAP 205* 214* 198*   Deconditioning/debility failure to thrive syndrome: Deconditioned-much more weaker than usual baseline-appetite continues to wax and wane as well-did not eat much yesterday-but had a significant amount of food the day before.  PT/OT/palliative care following-tentative plans for SNF early next week.    Goals of care: Is for gentle medical treatment-supportive care-encourage oral intake- per family [facility] to [facility], if further decline then full comfort.  Hard of hearing  Obesity: Estimated body mass index is 30.85 kg/m as calculated from the following:   Height as of this encounter: 5\' 10"  (1.778 m).   Weight as of this encounter: 97.5 kg.   GI prophylaxis: H2 Blocker  Condition - Extremely Guarded  Family Communication  :  Daughter-Ann-[phone] 03/15/20   Code Status :  DNR  Diet :  Diet Order            DIET - DYS 1 Room service appropriate? Yes with Assist; Fluid consistency: Nectar Thick  Diet effective now                  Disposition Plan  :   Status is: Inpatient Remains inpatient appropriate because:Inpatient level of care appropriate due to severity of illness   Dispo: The patient is from: Home              Anticipated d/c is to: TBD              Anticipated d/c date is: > 3 days              Patient currently is not medically stable to d/c.   Difficult to place patient No   Barriers to discharge: Hypoxia requiring O2 supplementation/complete 5 days of IV  Remdesivir  Antimicorbials  :    Anti-infectives (From admission, onward)   Start     Dose/Rate Route Frequency Ordered Stop  03/07/20 1000  remdesivir 100 mg in sodium chloride 0.9 % 100 mL IVPB       "Followed by" Linked Group Details   100 mg 200 mL/hr over 30 Minutes Intravenous Daily 03/06/20 0555 03/11/20 0804   03/06/20 1600  cefTRIAXone (ROCEPHIN) 1 g in sodium chloride 0.9 % 100 mL IVPB        1 g 200 mL/hr over 30 Minutes Intravenous Every 24 hours 03/06/20 0810 03/10/20 2135   03/06/20 1400  azithromycin (ZITHROMAX) 500 mg in sodium chloride 0.9 % 250 mL IVPB  Status:  Discontinued        500 mg 250 mL/hr over 60 Minutes Intravenous Every 24 hours 03/06/20 0810 03/06/20 0837   03/06/20 0900  azithromycin (ZITHROMAX) 500 mg in sodium chloride 0.9 % 250 mL IVPB  Status:  Discontinued        500 mg 250 mL/hr over 60 Minutes Intravenous Every 24 hours 03/06/20 0837 03/09/20 1052   03/06/20 0700  remdesivir 200 mg in sodium chloride 0.9% 250 mL IVPB       "Followed by" Linked Group Details   200 mg 580 mL/hr over 30 Minutes Intravenous Once 03/06/20 0555 03/06/20 0835   03/06/20 0430  vancomycin (VANCOREADY) IVPB 2000 mg/400 mL        2,000 mg 200 mL/hr over 120 Minutes Intravenous  Once 03/06/20 0423 03/06/20 0713   03/06/20 0400  ceFEPIme (MAXIPIME) 2 g in sodium chloride 0.9 % 100 mL IVPB        2 g 200 mL/hr over 30 Minutes Intravenous  Once 03/06/20 0358 03/06/20 0452   03/06/20 0400  metroNIDAZOLE (FLAGYL) IVPB 500 mg        500 mg 100 mL/hr over 60 Minutes Intravenous  Once 03/06/20 0358 03/06/20 0622   03/06/20 0400  vancomycin (VANCOCIN) IVPB 1000 mg/200 mL premix  Status:  Discontinued        1,000 mg 200 mL/hr over 60 Minutes Intravenous  Once 03/06/20 0358 03/06/20 0423      Inpatient Medications  Scheduled Meds: . albuterol  2 puff Inhalation Q6H  . amLODipine  10 mg Oral Daily  . vitamin C  500 mg Oral Daily  . aspirin EC  81 mg Oral Daily  .  Chlorhexidine Gluconate Cloth  6 each Topical Daily  . enoxaparin (LOVENOX) injection  40 mg Subcutaneous Q12H  . famotidine  20 mg Oral BID  . insulin aspart  0-15 Units Subcutaneous TID WC  . metoprolol succinate  75 mg Oral Daily  . QUEtiapine  25 mg Oral QHS  . sodium chloride flush  3 mL Intravenous Q12H  . zinc sulfate  220 mg Oral Daily   Continuous Infusions: . sodium chloride 10 mL/hr at 03/13/20 1500   PRN Meds:.acetaminophen, acetaminophen, dextrose, guaiFENesin-dextromethorphan, haloperidol lactate, hydrALAZINE, melatonin, ondansetron **OR** ondansetron (ZOFRAN) IV, phenol, Resource ThickenUp Clear, sodium chloride flush   Time Spent in minutes  25    See all Orders from today for further details   Lala Lund M.D on 03/15/2020 at 9:56 AM  To page go to [email or web address] - use universal password  [facility] -  Office  [phone]    Objective:   Vitals:   03/14/20 1413 03/14/20 1930 03/15/20 0414 03/15/20 0804  BP: 133/65 136/63 117/60 132/69  Pulse: 74 74 73 82  Resp: 15 16 16    Temp: 98.2 F (36.8 C) 98.8 F (37.1 C) 97.8 F (36.6 C)   TempSrc: Oral Axillary Axillary  SpO2: 95% 94% 94%   Weight:      Height:        Wt Readings from Last 3 Encounters:  03/06/20 97.5 kg     Intake/Output Summary (Last 24 hours) at 03/15/2020 0956 Last data filed at 03/15/2020 0859 Gross per 24 hour  Intake 460 ml  Output 1200 ml  Net -740 ml     Physical Exam  Awake but pleasantly confused and hard of hearing, appears to be in no distress, moving all 4 extremities, [place].AT,PERRAL Supple Neck,No JVD, No cervical lymphadenopathy appriciated.  Symmetrical Chest wall movement, Good air movement bilaterally, CTAB RRR,No Gallops, Rubs or new Murmurs, No Parasternal Heave +ve B.Sounds, Abd Soft with some suprapubic fullness, nontender No Cyanosis, Clubbing or edema, No new Rash or bruise    Data Review:    CBC Recent Labs  Lab 03/09/20 0433  03/10/20 0500 03/11/20 1005 03/12/20 0046 03/14/20 0749 03/15/20 0300  WBC 9.8 4.6 5.8 5.3 4.4 4.4  HGB 14.3 13.4 13.8 13.1 13.4 13.9  HCT 42.1 38.9* 39.1 38.4* 39.9 43.0  PLT 305 265 277 247 199 219  MCV 92.9 92.4 91.8 93.2 94.8 96.6  MCH 31.6 31.8 32.4 31.8 31.8 31.2  MCHC 34.0 34.4 35.3 34.1 33.6 32.3  RDW 14.8 14.7 14.7 15.1 15.1 15.3  LYMPHSABS 0.6* 0.6* 0.8 0.8  --   --   MONOABS 0.4 0.8 1.1* 0.9  --   --   EOSABS 0.0 0.0 0.2 0.2  --   --   BASOSABS 0.0 0.0 0.0 0.0  --   --     Chemistries  Recent Labs  Lab 03/09/20 0433 03/10/20 0500 03/11/20 0547 03/12/20 0046 03/13/20 0200 03/14/20 0749 03/15/20 0300  NA 142 143 143 147* 141 143 143  K 4.3 3.7 5.8* 4.4 4.1 4.0 4.3  CL 108 107 110 110 109 108 106  CO2 21* 22 21* 25 24 25 26   GLUCOSE 196* 96 95 95 194* 196* 192*  BUN 40* 41* 35* 32* 31* 26* 29*  CREATININE 1.30* 1.10 1.08 1.24 1.26* 1.03 1.20  CALCIUM 8.2* 8.1* 8.3* 8.6* 8.2* 8.3* 8.4*  MG 2.2 1.8 2.0 1.9  --   --   --   AST 103* 90* 132* 123*  --  84* 79*  ALT 53* 52* 54* 63*  --  72* 71*  ALKPHOS 82 75 75 80  --  78 103  BILITOT 0.6 0.8 2.0* 1.0  --  1.3* 1.1   ------------------------------------------------------------------------------------------------------------------ No results for input(s): CHOL, HDL, LDLCALC, TRIG, CHOLHDL, LDLDIRECT in the last 72 hours.  Lab Results  Component Value Date   HGBA1C 8.7 (H) 03/06/2020   ------------------------------------------------------------------------------------------------------------------ No results for input(s): TSH, T4TOTAL, T3FREE, THYROIDAB in the last 72 hours.  Invalid input(s): FREET3 ------------------------------------------------------------------------------------------------------------------ No results for input(s): VITAMINB12, FOLATE, FERRITIN, TIBC, IRON, RETICCTPCT in the last 72 hours.  Coagulation profile No results for input(s): INR, PROTIME in the last 168 hours.  Recent Labs     03/15/20 0300  DDIMER 3.22*    Cardiac Enzymes No results for input(s): CKMB, TROPONINI, MYOGLOBIN in the last 168 hours.  Invalid input(s): CK ------------------------------------------------------------------------------------------------------------------    Component Value Date/Time   BNP 87.3 03/06/2020 0820    Micro Results Recent Results (from the past 240 hour(s))  SARS Coronavirus 2 by RT PCR (hospital order, performed in [facility] hospital lab) Nasopharyngeal Nasopharyngeal Swab     Status: Abnormal   Collection Time: 03/06/20  3:58 AM  Specimen: Nasopharyngeal Swab  Result Value Ref Range Status   SARS Coronavirus 2 POSITIVE (A) NEGATIVE Final    Comment: RESULT CALLED TO, READ BACK BY AND VERIFIED WITH: K GIBSON RN 03/06/20 0538 JDW (NOTE) SARS-CoV-2 target nucleic acids are DETECTED  SARS-CoV-2 RNA is generally detectable in upper respiratory specimens  during the acute phase of infection.  Positive results are indicative  of the presence of the identified virus, but do not rule out bacterial infection or co-infection with other pathogens not detected by the test.  Clinical correlation with patient history and  other diagnostic information is necessary to determine patient infection status.  The expected result is negative.  Fact Sheet for Patients:   StrictlyIdeas.no   Fact Sheet for Healthcare Providers:   BankingDealers.co.za    This test is not yet approved or cleared by the [place] FDA and  has been authorized for detection and/or diagnosis of SARS-CoV-2 by FDA under an Emergency Use Authorization (EUA).  This EUA will remain in effect (meaning this test can  be used) for the duration of  the COVID-19 declaration under Section 564(b)(1) of the Act, 21 U.S.C. section 360-bbb-3(b)(1), unless the authorization is terminated or revoked sooner.  Performed at [facility], [place] [address],  [place], [place] [address]   Blood Culture (routine x 2)     Status: None   Collection Time: 03/06/20  3:58 AM   Specimen: BLOOD RIGHT WRIST  Result Value Ref Range Status   Specimen Description BLOOD RIGHT WRIST  Final   Special Requests   Final    BOTTLES DRAWN AEROBIC AND ANAEROBIC Blood Culture results may not be optimal due to an inadequate volume of blood received in culture bottles   Culture   Final    NO GROWTH 5 DAYS Performed at [facility], [place] [address], [place], [place] [address]    Report Status 03/11/2020 FINAL  Final  Urine culture     Status: None   Collection Time: 03/06/20  3:58 AM   Specimen: In/Out Cath Urine  Result Value Ref Range Status   Specimen Description IN/OUT CATH URINE  Final   Special Requests NONE  Final   Culture   Final    NO GROWTH Performed at [facility], [place] [address], [place], [place] [address]    Report Status 03/07/2020 FINAL  Final  Blood Culture (routine x 2)     Status: None   Collection Time: 03/06/20  4:20 AM   Specimen: BLOOD RIGHT HAND  Result Value Ref Range Status   Specimen Description BLOOD RIGHT HAND  Final   Special Requests   Final    BOTTLES DRAWN AEROBIC AND ANAEROBIC Blood Culture results may not be optimal due to an inadequate volume of blood received in culture bottles   Culture   Final    NO GROWTH 5 DAYS Performed at [facility], [place] [address] [place], [place] [address]    Report Status 03/11/2020 FINAL  Final    Radiology Reports DG Chest Port 1 View  Result Date: 03/06/2020 CLINICAL DATA:  Questionable sepsis EXAM: PORTABLE CHEST 1 VIEW COMPARISON:  10/30/2006 FINDINGS: Infiltrate at the left lung base. Accentuation of markings at the right base which may be atelectasis in the setting of low lung volumes. Cardiomegaly which is stable. No pulmonary edema, effusion, or pneumothorax. IMPRESSION: Low volume chest with infiltrate at the left base. Electronically Signed   By: Monte Fantasia  M.D.    On: 03/06/2020 04:13   DG Chest Port 1V same Day  Result Date: 03/07/2020 CLINICAL DATA:  Weakness. EXAM: PORTABLE CHEST 1 VIEW COMPARISON:  March 06, 2020. FINDINGS: Stable cardiomediastinal silhouette. Patchy airspace opacities are noted bilaterally consistent with multifocal pneumonia. No pneumothorax pleural effusion is noted. Bony thorax is unremarkable. IMPRESSION: Bilateral multifocal pneumonia. Electronically Signed   By: Marijo Conception M.D.   On: 03/07/2020 09:24   DG Swallowing Func-Speech Pathology  Result Date: 03/13/2020 Objective Swallowing Evaluation: Type of Study: MBS-Modified Barium Swallow Study  Patient Details Name: BEARL TALARICO MRN: [id number] Date of Birth: [birth year] Today's Date: 03/13/2020 Time: SLP Start Time (ACUTE ONLY): 1410 -SLP Stop Time (ACUTE ONLY): 1430 SLP Time Calculation (min) (ACUTE ONLY): 20 min Past Medical History: Past Medical History: Diagnosis Date . Diabetes mellitus without complication ([place])  . Hypertension  . Solitary kidney, congenital  Past Surgical History: No past surgical history on file. HPI: Pt is a 87 y.o. male with medical history significant of hypertension, diabetes mellitus type II, and solitary kidney who presented with complaints of weakness with nausea and vomiting and was found to have found to have acute metabolic encephalopathy, acute hypoxic respiratory failure, DKA due to COVID-19 infection. CXR 2/5: Bilateral multifocal pneumonia. Palliative care consulted 2/9  No data recorded Assessment / Plan / Recommendation CHL IP CLINICAL IMPRESSIONS 03/13/2020 Clinical Impression Pt presents with oropharyngeal dysphagia characterized by reduced posterior bolus propulsion, prolonged and inconsistent mastication, reduced bolus cohesion, a pharyngeal delay, reduced lingual retraction, reduced pharyngeal constriction, reduced hyolaryngeal elevation and reduced anterior laryngeal movement. He demonstrated premature spillage to the pyriform sinuses,  base of tongue residue, mild vallecular residue, posterior pharyngeal residue, pyriform sinus residue, and inconsistent incomplete epiglottic movement. Pt's swallow was often triggered with at least the head of the liquid bolus at the level of the pyriform sinuses with liquids. This resulted in penetration (PAS 5) and aspiration (PAS 7) of nectar thick liquids via straw and of thin liquids via cup/straw. Aspiration resulted in throat clearing and an occasional cough which was ineffective in expelling the aspirate. Pt was unable to demonstrate a cough despite prompts due to difficulty following commands. Aspirated material was mobilized superior to the vocal folds with throat clearing and exhilation, but subsequently aspirated again. Per conversation with Dr. Nena Alexander, pt's family would like the pt to remain on a p.o. diet at this time with known aspiration risk. A dysphagia 1 (puree) diet with nectar thick liquids is recommended at this time with strict observance of the swallowing precautions listed below to help mitigate aspiration risk as well as the risk of pulmonary-related complications. Pt is still at risk of aspiration with this diet, but honey thick liquids poses a more significant risk due to pharyngeal residue. SLP will continue to follow pt briefly. SLP Visit Diagnosis Dysphagia, oropharyngeal phase (R13.12) Attention and concentration deficit following -- Frontal lobe and executive function deficit following -- Impact on safety and function Moderate aspiration risk   CHL IP TREATMENT RECOMMENDATION 03/13/2020 Treatment Recommendations Therapy as outlined in treatment plan below   Prognosis 03/13/2020 Prognosis for Safe Diet Advancement Guarded Barriers to Reach Goals Severity of deficits;Time post onset Barriers/Prognosis Comment -- CHL IP DIET RECOMMENDATION 03/13/2020 SLP Diet Recommendations Dysphagia 1 (Puree) solids;Nectar thick liquid Liquid Administration via Cup;No straw Medication  Administration Crushed with puree Compensations Slow rate;Small sips/bites;Follow solids with liquid Postural Changes Remain semi-upright after after feeds/meals (Comment)   CHL IP OTHER RECOMMENDATIONS  03/13/2020 Recommended Consults -- Oral Care Recommendations Oral care BID Other Recommendations Order thickener from pharmacy   CHL IP FOLLOW UP RECOMMENDATIONS 03/13/2020 Follow up Recommendations Skilled Nursing facility;24 hour supervision/assistance   CHL IP FREQUENCY AND DURATION 03/13/2020 Speech Therapy Frequency (ACUTE ONLY) min 2x/week Treatment Duration 2 weeks      CHL IP ORAL PHASE 03/13/2020 Oral Phase Impaired Oral - Pudding Teaspoon -- Oral - Pudding Cup -- Oral - Honey Teaspoon Reduced posterior propulsion;Weak lingual manipulation;Lingual/palatal residue Oral - Honey Cup Reduced posterior propulsion;Weak lingual manipulation;Lingual/palatal residue Oral - Nectar Teaspoon NT Oral - Nectar Cup Reduced posterior propulsion;Weak lingual manipulation;Lingual/palatal residue Oral - Nectar Straw Reduced posterior propulsion;Weak lingual manipulation;Lingual/palatal residue Oral - Thin Teaspoon -- Oral - Thin Cup Reduced posterior propulsion;Weak lingual manipulation;Lingual/palatal residue Oral - Thin Straw Reduced posterior propulsion;Weak lingual manipulation;Lingual/palatal residue Oral - Puree Reduced posterior propulsion;Weak lingual manipulation;Lingual/palatal residue Oral - Mech Soft Reduced posterior propulsion;Weak lingual manipulation;Lingual/palatal residue;Impaired mastication Oral - Regular -- Oral - Multi-Consistency -- Oral - Pill -- Oral Phase - Comment --  CHL IP PHARYNGEAL PHASE 03/13/2020 Pharyngeal Phase Impaired Pharyngeal- Pudding Teaspoon -- Pharyngeal -- Pharyngeal- Pudding Cup -- Pharyngeal -- Pharyngeal- Honey Teaspoon Reduced tongue base retraction;Pharyngeal residue - valleculae;Pharyngeal residue - pyriform;Reduced anterior laryngeal mobility;Reduced epiglottic inversion;Reduced  laryngeal elevation;Reduced airway/laryngeal closure;Delayed swallow initiation-pyriform sinuses;Reduced pharyngeal peristalsis Pharyngeal -- Pharyngeal- Honey Cup Reduced tongue base retraction;Pharyngeal residue - valleculae;Pharyngeal residue - pyriform;Reduced anterior laryngeal mobility;Reduced epiglottic inversion;Reduced laryngeal elevation;Reduced airway/laryngeal closure;Delayed swallow initiation-pyriform sinuses;Reduced pharyngeal peristalsis Pharyngeal -- Pharyngeal- Nectar Teaspoon -- Pharyngeal -- Pharyngeal- Nectar Cup Reduced tongue base retraction;Pharyngeal residue - valleculae;Pharyngeal residue - pyriform;Reduced anterior laryngeal mobility;Reduced epiglottic inversion;Reduced laryngeal elevation;Reduced airway/laryngeal closure;Delayed swallow initiation-pyriform sinuses;Reduced pharyngeal peristalsis Pharyngeal -- Pharyngeal- Nectar Straw Reduced tongue base retraction;Pharyngeal residue - valleculae;Pharyngeal residue - pyriform;Reduced anterior laryngeal mobility;Reduced epiglottic inversion;Reduced laryngeal elevation;Reduced airway/laryngeal closure;Delayed swallow initiation-pyriform sinuses;Reduced pharyngeal peristalsis;Penetration/Aspiration during swallow;Penetration/Apiration after swallow Pharyngeal Material enters airway, passes BELOW cords and not ejected out despite cough attempt by patient;Material enters airway, CONTACTS cords and not ejected out Pharyngeal- Thin Teaspoon -- Pharyngeal -- Pharyngeal- Thin Cup Reduced tongue base retraction;Pharyngeal residue - valleculae;Pharyngeal residue - pyriform;Reduced anterior laryngeal mobility;Reduced epiglottic inversion;Reduced laryngeal elevation;Reduced airway/laryngeal closure;Delayed swallow initiation-pyriform sinuses;Reduced pharyngeal peristalsis;Penetration/Aspiration during swallow;Penetration/Apiration after swallow Pharyngeal Material enters airway, passes BELOW cords and not ejected out despite cough attempt by patient  Pharyngeal- Thin Straw Reduced tongue base retraction;Pharyngeal residue - valleculae;Pharyngeal residue - pyriform;Reduced anterior laryngeal mobility;Reduced epiglottic inversion;Reduced laryngeal elevation;Reduced airway/laryngeal closure;Delayed swallow initiation-pyriform sinuses;Reduced pharyngeal peristalsis;Penetration/Aspiration during swallow;Penetration/Apiration after swallow Pharyngeal Material enters airway, passes BELOW cords and not ejected out despite cough attempt by patient Pharyngeal- Puree Reduced tongue base retraction;Pharyngeal residue - valleculae;Pharyngeal residue - pyriform;Reduced anterior laryngeal mobility;Reduced epiglottic inversion;Reduced laryngeal elevation;Reduced airway/laryngeal closure;Delayed swallow initiation-pyriform sinuses;Reduced pharyngeal peristalsis Pharyngeal -- Pharyngeal- Mechanical Soft Reduced tongue base retraction;Pharyngeal residue - valleculae;Pharyngeal residue - pyriform;Reduced anterior laryngeal mobility;Reduced epiglottic inversion;Reduced laryngeal elevation;Reduced airway/laryngeal closure;Delayed swallow initiation-pyriform sinuses;Reduced pharyngeal peristalsis Pharyngeal -- Pharyngeal- Regular -- Pharyngeal -- Pharyngeal- Multi-consistency -- Pharyngeal -- Pharyngeal- Pill -- Pharyngeal -- Pharyngeal Comment --  No flowsheet data found. Shanika I. Hardin Negus, [place], [place] Office number [phone] Pager [phone] Horton Marshall 03/13/2020, 4:12 PM              VAS [place] LOWER EXTREMITY VENOUS (DVT)  Result Date: 03/08/2020  Lower Venous DVT Study Indications: Covid-19, elevated D-Dimer.  Limitations: Patient with altered mental status and constant movement. Comparison Study: No prior study on file  Performing Technologist: Sharion Dove RVS  Examination Guidelines: A complete evaluation includes B-mode imaging, spectral Doppler, color Doppler, and power Doppler as needed of all accessible portions of each vessel.  Bilateral testing is considered an integral part of a complete examination. Limited examinations for reoccurring indications may be performed as noted. The reflux portion of the exam is performed with the patient in reverse Trendelenburg.  +---------+---------------+---------+-----------+----------+-------------------+ RIGHT    CompressibilityPhasicitySpontaneityPropertiesThrombus Aging      +---------+---------------+---------+-----------+----------+-------------------+ CFV      Full           Yes      Yes                                      +---------+---------------+---------+-----------+----------+-------------------+ SFJ      Full                                                             +---------+---------------+---------+-----------+----------+-------------------+ FV Prox  Full                                                             +---------+---------------+---------+-----------+----------+-------------------+ FV Mid   Full                                                             +---------+---------------+---------+-----------+----------+-------------------+ FV DistalFull                                                             +---------+---------------+---------+-----------+----------+-------------------+ PFV      Full                                                             +---------+---------------+---------+-----------+----------+-------------------+ POP      Full           Yes      Yes                                      +---------+---------------+---------+-----------+----------+-------------------+ PTV      Full                                                             +---------+---------------+---------+-----------+----------+-------------------+  PERO                                                  Not well visualized +---------+---------------+---------+-----------+----------+-------------------+    +---------+---------------+---------+-----------+----------+-------------------+ LEFT     CompressibilityPhasicitySpontaneityPropertiesThrombus Aging      +---------+---------------+---------+-----------+----------+-------------------+ CFV      Full           Yes      Yes                                      +---------+---------------+---------+-----------+----------+-------------------+ SFJ      Full                                                             +---------+---------------+---------+-----------+----------+-------------------+ FV Prox  Full           Yes      Yes                                      +---------+---------------+---------+-----------+----------+-------------------+ FV Mid   Full                                                             +---------+---------------+---------+-----------+----------+-------------------+ FV DistalFull                                                             +---------+---------------+---------+-----------+----------+-------------------+ PFV      Full                                                             +---------+---------------+---------+-----------+----------+-------------------+ POP      Full           Yes      Yes                                      +---------+---------------+---------+-----------+----------+-------------------+ PTV      Full                                                             +---------+---------------+---------+-----------+----------+-------------------+ PERO  Not well visualized +---------+---------------+---------+-----------+----------+-------------------+    Summary: RIGHT: - There is no evidence of deep vein thrombosis in the lower extremity. However, portions of this examination were limited- see technologist comments above.  LEFT: - There is no evidence of deep vein thrombosis in the lower  extremity. However, portions of this examination were limited- see technologist comments above.  *See table(s) above for measurements and observations. Electronically signed by Monica Martinez MD on 03/08/2020 at 2:27:10 PM.    Final    [place] EKG SITE RITE  Result Date: 03/08/2020 If Site Rite image not attached, placement could not be confirmed due to current cardiac rhythm.

## 2020-03-15 NOTE — TOC Progression Note (Signed)
Transition of Care [facility]) - Progression Note    Patient Details  Name: HADRIAN YARBROUGH MRN: [id number] Date of Birth: 1926-08-31  Transition of Care [facility]) CM/SW Contact  Carles Collet, RN Phone Number: 03/15/2020, 8:44 AM  Clinical Narrative:   Received call from patients daughter [name]. She states she has dicussed DC plan with her family and they interested in patient going to [place] to work on strength with goal of progressing from assist of 2 to assist of 1 for him to eventually be able to return home.     Expected Discharge Plan: Skilled Nursing Facility Barriers to Discharge: SNF Covid,SNF Covid Recovering  Expected Discharge Plan and Services Expected Discharge Plan: [place] In-house Referral: NA Discharge Planning Services: NA Post Acute Care Choice: [place] Living arrangements for the past 2 months: Single Family Home                 DME Arranged: N/A DME Agency: NA       HH Arranged: NA HH Agency: NA         Social Determinants of Health (SDOH) Interventions    Readmission Risk Interventions No flowsheet data found.

## 2020-03-16 DIAGNOSIS — U071 COVID-19: Secondary | ICD-10-CM | POA: Diagnosis not present

## 2020-03-16 DIAGNOSIS — J1282 Pneumonia due to coronavirus disease 2019: Secondary | ICD-10-CM | POA: Diagnosis not present

## 2020-03-16 LAB — COMPREHENSIVE METABOLIC PANEL
ALT: 65 U/L — ABNORMAL HIGH (ref 0–44)
AST: 62 U/L — ABNORMAL HIGH (ref 15–41)
Albumin: 2.4 g/dL — ABNORMAL LOW (ref 3.5–5.0)
Alkaline Phosphatase: 87 U/L (ref 38–126)
Anion gap: 9 (ref 5–15)
BUN: 24 mg/dL — ABNORMAL HIGH (ref 8–23)
CO2: 24 mmol/L (ref 22–32)
Calcium: 8.4 mg/dL — ABNORMAL LOW (ref 8.9–10.3)
Chloride: 109 mmol/L (ref 98–111)
Creatinine, Ser: 0.98 mg/dL (ref 0.61–1.24)
GFR, Estimated: >60 mL/min (ref >60)
Glucose, Bld: 216 mg/dL — ABNORMAL HIGH (ref 70–99)
Potassium: 4.2 mmol/L (ref 3.5–5.1)
Sodium: 142 mmol/L (ref 135–145)
Total Bilirubin: 1.3 mg/dL — ABNORMAL HIGH (ref 0.3–1.2)
Total Protein: 4.6 g/dL — ABNORMAL LOW (ref 6.5–8.1)

## 2020-03-16 LAB — CBC
HCT: 43.5 % (ref 39.0–52.0)
Hemoglobin: 14 g/dL (ref 13.0–17.0)
MCH: 31.1 pg (ref 26.0–34.0)
MCHC: 32.2 g/dL (ref 30.0–36.0)
MCV: 96.7 fL (ref 80.0–100.0)
Platelets: 179 10*3/uL (ref 150–400)
RBC: 4.5 MIL/uL (ref 4.22–5.81)
RDW: 15.4 % (ref 11.5–15.5)
WBC: 3.3 10*3/uL — ABNORMAL LOW (ref 4.0–10.5)
nRBC: 0 % (ref 0.0–0.2)

## 2020-03-16 LAB — GLUCOSE, CAPILLARY
Glucose-Capillary: 180 mg/dL — ABNORMAL HIGH (ref 70–99)
Glucose-Capillary: 214 mg/dL — ABNORMAL HIGH (ref 70–99)
Glucose-Capillary: 152 mg/dL — ABNORMAL HIGH (ref 70–99)
Glucose-Capillary: 201 mg/dL — ABNORMAL HIGH (ref 70–99)

## 2020-03-16 LAB — D-DIMER, QUANTITATIVE: D-Dimer, Quant: 3.89 ug/mL-FEU — ABNORMAL HIGH (ref 0.00–0.50)

## 2020-03-16 LAB — C-REACTIVE PROTEIN: CRP: <0.5 mg/dL (ref <1.0)

## 2020-03-16 NOTE — TOC Progression Note (Addendum)
Transition of Care [facility]) - Progression Note    Patient Details  Name: Joe [name] MRN: [id number] Date of Birth: 1926-06-17  Transition of Care [facility]) CM/SW [place], LCSW Phone Number: 03/16/2020, 9:42 AM  Clinical Narrative:    9:42am-CSW awaiting response from [facility] regarding bed availability.   10am-Now [place] stating their newest policy change states patient must be vaccinated regardless of COVID status. CSW checking with [facility].    Expected Discharge Plan: Skilled Nursing Facility Barriers to Discharge: SNF Covid,SNF Covid Recovering  Expected Discharge Plan and Services Expected Discharge Plan: [place] In-house Referral: NA Discharge Planning Services: NA Post Acute Care Choice: [place] Living arrangements for the past 2 months: Single Family Home                 DME Arranged: N/A DME Agency: NA       HH Arranged: NA HH Agency: NA         Social Determinants of Health (SDOH) Interventions    Readmission Risk Interventions No flowsheet data found.

## 2020-03-16 NOTE — Progress Notes (Signed)
PROGRESS NOTE                                                                                                                                                                                                             Patient Demographics:    Joe [name], is a 87 y.o. male, DOB - [birth year], EHM:[id number]  Outpatient Primary MD for the patient is Patient, No Pcp Per   Admit date - 03/06/2020   LOS - 10  Chief Complaint  Patient presents with  . Emesis       Brief Narrative: Patient is a 87 y.o. male with PMHx of HTN, DM-2, solitary kidney-presenting with confusion/nausea and vomiting-found to have acute metabolic encephalopathy, acute hypoxic respiratory failure (6 L HFNC), DKA due to COVID-19 infection.  Patient was started on IV insulin/steroids/Remdesivir-given 1 dose of Actemra-clinically improved-further hospital course now complicated by delirium/poor oral intake and failure to thrive syndrome.   COVID-19 vaccinated status:   Significant Events: 2/5>> Admit to [facility] for sepsis/hypoxia (on 6 L of HFNC)-due to COVID-19, AKI and DKA  Significant studies: 2/4>> chest x-ray: Left base infiltrate 2/5>>Chest x-ray: Bilateral multifocal pneumonia 2/5>> bilateral lower extremity Doppler: No DVT.  COVID-19 medications: Steroids: 2/4>>2/10 Remdesivir: 2/4>> 2/8 Actemra: X1 on 2/4  Antibiotics: Zithromax: 2/4>>2/6 Rocephin: 2/4>> 2/8 Cefepime: 2/4 x 1 Flagyl: 2/4 x 1 Vancomycin: 2/4 x 1  Microbiology data: 2/4 >>blood culture: No growth 2/4>> urine culture: No growth  Procedures: None  Consults: Palliative care  DVT prophylaxis: enoxaparin (LOVENOX) injection 40 mg Start: 03/15/20 1000    Subjective:   She had in bed, slightly more somnolent than yesterday, appears to be in no distress, moving all 4 extremities to painful stimuli, unable to reliably answer questions or follow commands.   Assessment   & Plan :   Acute Hypoxic Resp Failure due to Covid 19 Viral pneumonia +/-bacterial pneumonia: Remains stable-on 2-3 L of oxygen this morning-has completed a course of steroids/antimicrobial therapy.  He continues to  encephalopathy-and remains at risk for aspiration.  See goals of care discussion below    Fever: Afebrile. O2 requirements:  SpO2: 93 % O2 Flow Rate (L/min): 4 L/min   COVID-19 Labs: Recent Labs    03/15/20 0300 03/16/20 0418  DDIMER 3.22* 3.89*  CRP 0.6 <0.5       Component Value  Date/Time   BNP 87.3 03/06/2020 0820    No results for input(s): PROCALCITON in the last 168 hours.  Lab Results  Component Value Date   SARSCOV2NAA POSITIVE (A) 03/06/2020     Prone/Incentive Spirometry: Encouraged incentive spirometry use-but confused-will not follow commands.  Elevated D-dimer: Likely due to COVID-19 related inflammation-lower extremity Dopplers negative-with improving hypoxemia-doubt further work-up is required.  On prophylactic Lovenox.  Sepsis due to COVID-19 pneumonia +/-bacterial pneumonia: Sepsis physiology has resolved-cultures negative so far-has completed a course of antimicrobial therapy  Acute metabolic encephalopathy: Multifactorial-from sepsis/hypoxemia/DKA/steroid use-has significant hearing impairment at baseline-probably has some amount of cognitive dysfunction as well.  Continues to have confusion-but seems to wax and wane in severity at times.  She is getting more somnolent will discontinue Seroquel and melatonin, continue to monitor on as needed Haldol.  Dysphagia: Due to severe deconditioning-probably has some amount of dysphagia at baseline-SLP following-with plans for possible MBS later today.  Family accepting all risk of aspiration-no plans to start tube feeds at all.  DKA: Overall improved-transitioned off insulin infusion-on SQ insulin  Acute kidney injury: Likely hemodynamically mediated-has resolved.  Has congenital solitary kidney.    Hypernatremia: Resolved with hypotonic saline.  Secondary to poor oral intake.  Hypomagnesemia: Repleted  HTN: BP stable-continue metoprolol and amlodipine.  Reassess in the next 1-2 days for further adjustment.    Urinary retention - Flomax, foley.  Insulin-dependent DM-2 (A1c 8.7 on 2/4): CBGs stable-poor oral intake-at risk for hypoglycemia-continue SSI.  Not a candidate for aggressive glycemic control.  Allow permissive hyperglycemia.     Recent Labs    03/15/20 1659 03/15/20 2047 03/16/20 0727  GLUCAP 234* 199* 180*   Deconditioning/debility failure to thrive syndrome: Deconditioned-much more weaker than usual baseline-appetite continues to wax and wane as well-did not eat much yesterday-but had a significant amount of food the day before.  PT/OT/palliative care following-tentative plans for SNF early next week.    Goals of care: Is for gentle medical treatment-supportive care-encourage oral intake- per family discharge to [facility], if further decline then full comfort.  Hard of hearing  Obesity: Estimated body mass index is 30.85 kg/m as calculated from the following:   Height as of this encounter: 5\' 10"  (1.778 m).   Weight as of this encounter: 97.5 kg.   GI prophylaxis: H2 Blocker  Condition - Extremely Guarded  Family Communication  :  Daughter-[phone] 03/15/20   Code Status :  DNR  Diet :  Diet Order            DIET - DYS 1 Room service appropriate? Yes with Assist; Fluid consistency: Nectar Thick  Diet effective now                  Disposition Plan  :   Status is: Inpatient Remains inpatient appropriate because:Inpatient level of care appropriate due to severity of illness   Dispo: The patient is from: Home              Anticipated d/c is to: TBD              Anticipated d/c date is: > 3 days              Patient currently is not medically stable to d/c.   Difficult to place patient No   Barriers to discharge: Hypoxia requiring  O2 supplementation/complete 5 days of IV Remdesivir  Antimicorbials  :    Anti-infectives (From admission, onward)   Start  Dose/Rate Route Frequency Ordered Stop   03/07/20 1000  remdesivir 100 mg in sodium chloride 0.9 % 100 mL IVPB       "Followed by" Linked Group Details   100 mg 200 mL/hr over 30 Minutes Intravenous Daily 03/06/20 0555 03/11/20 0804   03/06/20 1600  cefTRIAXone (ROCEPHIN) 1 g in sodium chloride 0.9 % 100 mL IVPB        1 g 200 mL/hr over 30 Minutes Intravenous Every 24 hours 03/06/20 0810 03/10/20 2135   03/06/20 1400  azithromycin (ZITHROMAX) 500 mg in sodium chloride 0.9 % 250 mL IVPB  Status:  Discontinued        500 mg 250 mL/hr over 60 Minutes Intravenous Every 24 hours 03/06/20 0810 03/06/20 0837   03/06/20 0900  azithromycin (ZITHROMAX) 500 mg in sodium chloride 0.9 % 250 mL IVPB  Status:  Discontinued        500 mg 250 mL/hr over 60 Minutes Intravenous Every 24 hours 03/06/20 0837 03/09/20 1052   03/06/20 0700  remdesivir 200 mg in sodium chloride 0.9% 250 mL IVPB       "Followed by" Linked Group Details   200 mg 580 mL/hr over 30 Minutes Intravenous Once 03/06/20 0555 03/06/20 0835   03/06/20 0430  vancomycin (VANCOREADY) IVPB 2000 mg/400 mL        2,000 mg 200 mL/hr over 120 Minutes Intravenous  Once 03/06/20 0423 03/06/20 0713   03/06/20 0400  ceFEPIme (MAXIPIME) 2 g in sodium chloride 0.9 % 100 mL IVPB        2 g 200 mL/hr over 30 Minutes Intravenous  Once 03/06/20 0358 03/06/20 0452   03/06/20 0400  metroNIDAZOLE (FLAGYL) IVPB 500 mg        500 mg 100 mL/hr over 60 Minutes Intravenous  Once 03/06/20 0358 03/06/20 0622   03/06/20 0400  vancomycin (VANCOCIN) IVPB 1000 mg/200 mL premix  Status:  Discontinued        1,000 mg 200 mL/hr over 60 Minutes Intravenous  Once 03/06/20 0358 03/06/20 0423      Inpatient Medications  Scheduled Meds: . albuterol  2 puff Inhalation Q6H  . amLODipine  10 mg Oral Daily  . vitamin C  500 mg Oral Daily  .  aspirin EC  81 mg Oral Daily  . Chlorhexidine Gluconate Cloth  6 each Topical Daily  . enoxaparin (LOVENOX) injection  40 mg Subcutaneous Q12H  . famotidine  20 mg Oral BID  . insulin aspart  0-15 Units Subcutaneous TID WC  . metoprolol succinate  75 mg Oral Daily  . QUEtiapine  25 mg Oral QHS  . sodium chloride flush  3 mL Intravenous Q12H  . tamsulosin  0.4 mg Oral Daily  . zinc sulfate  220 mg Oral Daily   Continuous Infusions: . sodium chloride 10 mL/hr at 03/13/20 1500   PRN Meds:.acetaminophen, acetaminophen, dextrose, guaiFENesin-dextromethorphan, haloperidol lactate, hydrALAZINE, melatonin, ondansetron **OR** ondansetron (ZOFRAN) IV, phenol, Resource ThickenUp Clear, sodium chloride flush   Time Spent in minutes  25    See all Orders from today for further details   Lala Lund M.D on 03/16/2020 at 9:43 AM  To page go to [email or web address] - use universal password  [facility] -  Office  [phone]    Objective:   Vitals:   03/15/20 1203 03/15/20 2100 03/15/20 2126 03/16/20 0400  BP: 125/60 (!) 180/79 (!) 187/70 125/68  Pulse: 67 75  76  Resp: 15 18  14   Temp: (!) 97.5  F (36.4 C) 97.9 F (36.6 C)  97.6 F (36.4 C)  TempSrc: Oral Axillary  Axillary  SpO2: 95% 95%  93%  Weight:      Height:        Wt Readings from Last 3 Encounters:  03/06/20 97.5 kg     Intake/Output Summary (Last 24 hours) at 03/16/2020 0943 Last data filed at 03/16/2020 0938 Gross per 24 hour  Intake 360 ml  Output 500 ml  Net -140 ml     Physical Exam  Sleeping but arousable, overall slightly more somnolent and remains pleasantly confused and hard of hearing, appears to be in no distress, moving all 4 extremities, foley in [place].AT,PERRAL Supple Neck,No JVD, No cervical lymphadenopathy appriciated.  Symmetrical Chest wall movement, Good air movement bilaterally, CTAB RRR,No Gallops, Rubs or new Murmurs, No Parasternal Heave +ve B.Sounds, Abd Soft, No tenderness, No  organomegaly appriciated, No rebound - guarding or rigidity. No Cyanosis, Clubbing or edema, No new Rash or bruise     Data Review:    CBC Recent Labs  Lab 03/10/20 0500 03/11/20 1005 03/12/20 0046 03/14/20 0749 03/15/20 0300 03/16/20 0613  WBC 4.6 5.8 5.3 4.4 4.4 3.3*  HGB 13.4 13.8 13.1 13.4 13.9 14.0  HCT 38.9* 39.1 38.4* 39.9 43.0 43.5  PLT 265 277 247 199 219 179  MCV 92.4 91.8 93.2 94.8 96.6 96.7  MCH 31.8 32.4 31.8 31.8 31.2 31.1  MCHC 34.4 35.3 34.1 33.6 32.3 32.2  RDW 14.7 14.7 15.1 15.1 15.3 15.4  LYMPHSABS 0.6* 0.8 0.8  --   --   --   MONOABS 0.8 1.1* 0.9  --   --   --   EOSABS 0.0 0.2 0.2  --   --   --   BASOSABS 0.0 0.0 0.0  --   --   --     Chemistries  Recent Labs  Lab 03/10/20 0500 03/11/20 0547 03/12/20 0046 03/13/20 0200 03/14/20 0749 03/15/20 0300 03/16/20 0418  NA 143 143 147* 141 143 143 142  K 3.7 5.8* 4.4 4.1 4.0 4.3 4.2  CL 107 110 110 109 108 106 109  CO2 22 21* 25 24 25 26 24   GLUCOSE 96 95 95 194* 196* 192* 216*  BUN 41* 35* 32* 31* 26* 29* 24*  CREATININE 1.10 1.08 1.24 1.26* 1.03 1.20 0.98  CALCIUM 8.1* 8.3* 8.6* 8.2* 8.3* 8.4* 8.4*  MG 1.8 2.0 1.9  --   --   --   --   AST 90* 132* 123*  --  84* 79* 62*  ALT 52* 54* 63*  --  72* 71* 65*  ALKPHOS 75 75 80  --  78 103 87  BILITOT 0.8 2.0* 1.0  --  1.3* 1.1 1.3*   ------------------------------------------------------------------------------------------------------------------ No results for input(s): CHOL, HDL, LDLCALC, TRIG, CHOLHDL, LDLDIRECT in the last 72 hours.  Lab Results  Component Value Date   HGBA1C 8.7 (H) 03/06/2020   ------------------------------------------------------------------------------------------------------------------ No results for input(s): TSH, T4TOTAL, T3FREE, THYROIDAB in the last 72 hours.  Invalid input(s): FREET3 ------------------------------------------------------------------------------------------------------------------ No results for  input(s): VITAMINB12, FOLATE, FERRITIN, TIBC, IRON, RETICCTPCT in the last 72 hours.  Coagulation profile No results for input(s): INR, PROTIME in the last 168 hours.  Recent Labs    03/15/20 0300 03/16/20 0418  DDIMER 3.22* 3.89*    Cardiac Enzymes No results for input(s): CKMB, TROPONINI, MYOGLOBIN in the last 168 hours.  Invalid input(s): CK ------------------------------------------------------------------------------------------------------------------    Component Value Date/Time   BNP 87.3  03/06/2020 0820    Micro Results No results found for this or any previous visit (from the past 240 hour(s)).  Radiology Reports DG Chest Port 1 View  Result Date: 03/06/2020 CLINICAL DATA:  Questionable sepsis EXAM: PORTABLE CHEST 1 VIEW COMPARISON:  10/30/2006 FINDINGS: Infiltrate at the left lung base. Accentuation of markings at the right base which may be atelectasis in the setting of low lung volumes. Cardiomegaly which is stable. No pulmonary edema, effusion, or pneumothorax. IMPRESSION: Low volume chest with infiltrate at the left base. Electronically Signed   By: Monte Fantasia M.D.   On: 03/06/2020 04:13   DG Chest Port 1V same Day  Result Date: 03/07/2020 CLINICAL DATA:  Weakness. EXAM: PORTABLE CHEST 1 VIEW COMPARISON:  March 06, 2020. FINDINGS: Stable cardiomediastinal silhouette. Patchy airspace opacities are noted bilaterally consistent with multifocal pneumonia. No pneumothorax pleural effusion is noted. Bony thorax is unremarkable. IMPRESSION: Bilateral multifocal pneumonia. Electronically Signed   By: Marijo Conception M.D.   On: 03/07/2020 09:24   DG Swallowing Func-Speech Pathology  Result Date: 03/13/2020 Objective Swallowing Evaluation: Type of Study: MBS-Modified Barium Swallow Study  Patient Details Name: DARIOUS REHMAN MRN: [id number] Date of Birth: 03/27/1926 Today's Date: 03/13/2020 Time: SLP Start Time (ACUTE ONLY): 1410 -SLP Stop Time (ACUTE ONLY): 1430 SLP Time  Calculation (min) (ACUTE ONLY): 20 min Past Medical History: Past Medical History: Diagnosis Date . Diabetes mellitus without complication ([place])  . Hypertension  . Solitary kidney, congenital  Past Surgical History: No past surgical history on file. HPI: Pt is a 87 y.o. male with medical history significant of hypertension, diabetes mellitus type II, and solitary kidney who presented with complaints of weakness with nausea and vomiting and was found to have found to have acute metabolic encephalopathy, acute hypoxic respiratory failure, DKA due to COVID-19 infection. CXR 2/5: Bilateral multifocal pneumonia. Palliative care consulted 2/9  No data recorded Assessment / Plan / Recommendation CHL IP CLINICAL IMPRESSIONS 03/13/2020 Clinical Impression Pt presents with oropharyngeal dysphagia characterized by reduced posterior bolus propulsion, prolonged and inconsistent mastication, reduced bolus cohesion, a pharyngeal delay, reduced lingual retraction, reduced pharyngeal constriction, reduced hyolaryngeal elevation and reduced anterior laryngeal movement. He demonstrated premature spillage to the pyriform sinuses, base of tongue residue, mild vallecular residue, posterior pharyngeal residue, pyriform sinus residue, and inconsistent incomplete epiglottic movement. Pt's swallow was often triggered with at least the head of the liquid bolus at the level of the pyriform sinuses with liquids. This resulted in penetration (PAS 5) and aspiration (PAS 7) of nectar thick liquids via straw and of thin liquids via cup/straw. Aspiration resulted in throat clearing and an occasional cough which was ineffective in expelling the aspirate. Pt was unable to demonstrate a cough despite prompts due to difficulty following commands. Aspirated material was mobilized superior to the vocal folds with throat clearing and exhilation, but subsequently aspirated again. Per conversation with Dr. Nena Alexander, pt's family would like the pt to remain  on a p.o. diet at this time with known aspiration risk. A dysphagia 1 (puree) diet with nectar thick liquids is recommended at this time with strict observance of the swallowing precautions listed below to help mitigate aspiration risk as well as the risk of pulmonary-related complications. Pt is still at risk of aspiration with this diet, but honey thick liquids poses a more significant risk due to pharyngeal residue. SLP will continue to follow pt briefly. SLP Visit Diagnosis Dysphagia, oropharyngeal phase (R13.12) Attention and concentration deficit following -- Frontal  lobe and executive function deficit following -- Impact on safety and function Moderate aspiration risk   CHL IP TREATMENT RECOMMENDATION 03/13/2020 Treatment Recommendations Therapy as outlined in treatment plan below   Prognosis 03/13/2020 Prognosis for Safe Diet Advancement Guarded Barriers to Reach Goals Severity of deficits;Time post onset Barriers/Prognosis Comment -- CHL IP DIET RECOMMENDATION 03/13/2020 SLP Diet Recommendations Dysphagia 1 (Puree) solids;Nectar thick liquid Liquid Administration via Cup;No straw Medication Administration Crushed with puree Compensations Slow rate;Small sips/bites;Follow solids with liquid Postural Changes Remain semi-upright after after feeds/meals (Comment)   CHL IP OTHER RECOMMENDATIONS 03/13/2020 Recommended Consults -- Oral Care Recommendations Oral care BID Other Recommendations Order thickener from pharmacy   CHL IP FOLLOW UP RECOMMENDATIONS 03/13/2020 Follow up Recommendations Skilled Nursing facility;24 hour supervision/assistance   CHL IP FREQUENCY AND DURATION 03/13/2020 Speech Therapy Frequency (ACUTE ONLY) min 2x/week Treatment Duration 2 weeks      CHL IP ORAL PHASE 03/13/2020 Oral Phase Impaired Oral - Pudding Teaspoon -- Oral - Pudding Cup -- Oral - Honey Teaspoon Reduced posterior propulsion;Weak lingual manipulation;Lingual/palatal residue Oral - Honey Cup Reduced posterior propulsion;Weak  lingual manipulation;Lingual/palatal residue Oral - Nectar Teaspoon NT Oral - Nectar Cup Reduced posterior propulsion;Weak lingual manipulation;Lingual/palatal residue Oral - Nectar Straw Reduced posterior propulsion;Weak lingual manipulation;Lingual/palatal residue Oral - Thin Teaspoon -- Oral - Thin Cup Reduced posterior propulsion;Weak lingual manipulation;Lingual/palatal residue Oral - Thin Straw Reduced posterior propulsion;Weak lingual manipulation;Lingual/palatal residue Oral - Puree Reduced posterior propulsion;Weak lingual manipulation;Lingual/palatal residue Oral - Mech Soft Reduced posterior propulsion;Weak lingual manipulation;Lingual/palatal residue;Impaired mastication Oral - Regular -- Oral - Multi-Consistency -- Oral - Pill -- Oral Phase - Comment --  CHL IP PHARYNGEAL PHASE 03/13/2020 Pharyngeal Phase Impaired Pharyngeal- Pudding Teaspoon -- Pharyngeal -- Pharyngeal- Pudding Cup -- Pharyngeal -- Pharyngeal- Honey Teaspoon Reduced tongue base retraction;Pharyngeal residue - valleculae;Pharyngeal residue - pyriform;Reduced anterior laryngeal mobility;Reduced epiglottic inversion;Reduced laryngeal elevation;Reduced airway/laryngeal closure;Delayed swallow initiation-pyriform sinuses;Reduced pharyngeal peristalsis Pharyngeal -- Pharyngeal- Honey Cup Reduced tongue base retraction;Pharyngeal residue - valleculae;Pharyngeal residue - pyriform;Reduced anterior laryngeal mobility;Reduced epiglottic inversion;Reduced laryngeal elevation;Reduced airway/laryngeal closure;Delayed swallow initiation-pyriform sinuses;Reduced pharyngeal peristalsis Pharyngeal -- Pharyngeal- Nectar Teaspoon -- Pharyngeal -- Pharyngeal- Nectar Cup Reduced tongue base retraction;Pharyngeal residue - valleculae;Pharyngeal residue - pyriform;Reduced anterior laryngeal mobility;Reduced epiglottic inversion;Reduced laryngeal elevation;Reduced airway/laryngeal closure;Delayed swallow initiation-pyriform sinuses;Reduced pharyngeal  peristalsis Pharyngeal -- Pharyngeal- Nectar Straw Reduced tongue base retraction;Pharyngeal residue - valleculae;Pharyngeal residue - pyriform;Reduced anterior laryngeal mobility;Reduced epiglottic inversion;Reduced laryngeal elevation;Reduced airway/laryngeal closure;Delayed swallow initiation-pyriform sinuses;Reduced pharyngeal peristalsis;Penetration/Aspiration during swallow;Penetration/Apiration after swallow Pharyngeal Material enters airway, passes BELOW cords and not ejected out despite cough attempt by patient;Material enters airway, CONTACTS cords and not ejected out Pharyngeal- Thin Teaspoon -- Pharyngeal -- Pharyngeal- Thin Cup Reduced tongue base retraction;Pharyngeal residue - valleculae;Pharyngeal residue - pyriform;Reduced anterior laryngeal mobility;Reduced epiglottic inversion;Reduced laryngeal elevation;Reduced airway/laryngeal closure;Delayed swallow initiation-pyriform sinuses;Reduced pharyngeal peristalsis;Penetration/Aspiration during swallow;Penetration/Apiration after swallow Pharyngeal Material enters airway, passes BELOW cords and not ejected out despite cough attempt by patient Pharyngeal- Thin Straw Reduced tongue base retraction;Pharyngeal residue - valleculae;Pharyngeal residue - pyriform;Reduced anterior laryngeal mobility;Reduced epiglottic inversion;Reduced laryngeal elevation;Reduced airway/laryngeal closure;Delayed swallow initiation-pyriform sinuses;Reduced pharyngeal peristalsis;Penetration/Aspiration during swallow;Penetration/Apiration after swallow Pharyngeal Material enters airway, passes BELOW cords and not ejected out despite cough attempt by patient Pharyngeal- Puree Reduced tongue base retraction;Pharyngeal residue - valleculae;Pharyngeal residue - pyriform;Reduced anterior laryngeal mobility;Reduced epiglottic inversion;Reduced laryngeal elevation;Reduced airway/laryngeal closure;Delayed swallow initiation-pyriform sinuses;Reduced pharyngeal peristalsis Pharyngeal --  Pharyngeal- Mechanical Soft Reduced tongue base retraction;Pharyngeal residue - valleculae;Pharyngeal residue - pyriform;Reduced anterior laryngeal mobility;Reduced epiglottic inversion;Reduced laryngeal elevation;Reduced airway/laryngeal  closure;Delayed swallow initiation-pyriform sinuses;Reduced pharyngeal peristalsis Pharyngeal -- Pharyngeal- Regular -- Pharyngeal -- Pharyngeal- Multi-consistency -- Pharyngeal -- Pharyngeal- Pill -- Pharyngeal -- Pharyngeal Comment --  No flowsheet data found. Shanika I. Hardin Negus, [place], [place] Office number [phone] Pager [phone] Horton Marshall 03/13/2020, 4:12 PM              VAS [place] LOWER EXTREMITY VENOUS (DVT)  Result Date: 03/08/2020  Lower Venous DVT Study Indications: Covid-19, elevated D-Dimer.  Limitations: Patient with altered mental status and constant movement. Comparison Study: No prior study on file Performing Technologist: Sharion Dove RVS  Examination Guidelines: A complete evaluation includes B-mode imaging, spectral Doppler, color Doppler, and power Doppler as needed of all accessible portions of each vessel. Bilateral testing is considered an integral part of a complete examination. Limited examinations for reoccurring indications may be performed as noted. The reflux portion of the exam is performed with the patient in reverse Trendelenburg.  +---------+---------------+---------+-----------+----------+-------------------+ RIGHT    CompressibilityPhasicitySpontaneityPropertiesThrombus Aging      +---------+---------------+---------+-----------+----------+-------------------+ CFV      Full           Yes      Yes                                      +---------+---------------+---------+-----------+----------+-------------------+ SFJ      Full                                                             +---------+---------------+---------+-----------+----------+-------------------+ FV Prox  Full                                                              +---------+---------------+---------+-----------+----------+-------------------+ FV Mid   Full                                                             +---------+---------------+---------+-----------+----------+-------------------+ FV DistalFull                                                             +---------+---------------+---------+-----------+----------+-------------------+ PFV      Full                                                             +---------+---------------+---------+-----------+----------+-------------------+ POP      Full           Yes      Yes                                      +---------+---------------+---------+-----------+----------+-------------------+  PTV      Full                                                             +---------+---------------+---------+-----------+----------+-------------------+ PERO                                                  Not well visualized +---------+---------------+---------+-----------+----------+-------------------+   +---------+---------------+---------+-----------+----------+-------------------+ LEFT     CompressibilityPhasicitySpontaneityPropertiesThrombus Aging      +---------+---------------+---------+-----------+----------+-------------------+ CFV      Full           Yes      Yes                                      +---------+---------------+---------+-----------+----------+-------------------+ SFJ      Full                                                             +---------+---------------+---------+-----------+----------+-------------------+ FV Prox  Full           Yes      Yes                                      +---------+---------------+---------+-----------+----------+-------------------+ FV Mid   Full                                                              +---------+---------------+---------+-----------+----------+-------------------+ FV DistalFull                                                             +---------+---------------+---------+-----------+----------+-------------------+ PFV      Full                                                             +---------+---------------+---------+-----------+----------+-------------------+ POP      Full           Yes      Yes                                      +---------+---------------+---------+-----------+----------+-------------------+ PTV      Full                                                             +---------+---------------+---------+-----------+----------+-------------------+  PERO                                                  Not well visualized +---------+---------------+---------+-----------+----------+-------------------+    Summary: RIGHT: - There is no evidence of deep vein thrombosis in the lower extremity. However, portions of this examination were limited- see technologist comments above.  LEFT: - There is no evidence of deep vein thrombosis in the lower extremity. However, portions of this examination were limited- see technologist comments above.  *See table(s) above for measurements and observations. Electronically signed by Monica Martinez MD on 03/08/2020 at 2:27:10 PM.    Final    [place] EKG SITE RITE  Result Date: 03/08/2020 If Site Rite image not attached, placement could not be confirmed due to current cardiac rhythm.

## 2020-03-16 NOTE — Progress Notes (Signed)
Mitts remain in place d/t confusion so that pt does not remove any important lines. Will cont to monitor.

## 2020-03-16 NOTE — TOC Progression Note (Addendum)
Transition of Care [facility]) - Progression Note    Patient Details  Name: Joe [name] MRN: [id number] Date of Birth: 12/10/1926  Transition [facility] Care [facility]) CM/SW [place], [place] Phone Number: 03/16/2020, 1:40 PM  Clinical Narrative:    CSW spoke with patients daughter, Bren Steers ([phone]) and informed her about [facility]'s new policy about them not taking unvaccinated patients regardless of COVID status and that [facility] is our only option. CSW will follow up with her tomorrow for final conformation. Ann reported being upset due to speaking with someone at [facility] yesterday and not hearing anything about this new change and CSW reported understanding and provided empathic listening.   CSW called the patients daughter back and left a message reporting that [facility] is able to take her father after all and to call back if she has any questions.   Expected Discharge Plan: Skilled Nursing Facility Barriers to Discharge: SNF Covid,SNF Covid Recovering  Expected Discharge Plan and Services Expected Discharge Plan: [place] In-house Referral: NA Discharge Planning Services: NA Post Acute Care Choice: [place] Living arrangements for the past 2 months: Single Family Home                 DME Arranged: N/A DME Agency: NA       HH Arranged: NA HH Agency: NA         Social Determinants of Health (SDOH) Interventions    Readmission Risk Interventions No flowsheet data found.

## 2020-03-17 DIAGNOSIS — J9601 Acute respiratory failure with hypoxia: Secondary | ICD-10-CM | POA: Diagnosis not present

## 2020-03-17 LAB — CBC
HCT: 41.3 % (ref 39.0–52.0)
Hemoglobin: 13.9 g/dL (ref 13.0–17.0)
MCH: 32.3 pg (ref 26.0–34.0)
MCHC: 33.7 g/dL (ref 30.0–36.0)
MCV: 95.8 fL (ref 80.0–100.0)
Platelets: 182 10*3/uL (ref 150–400)
RBC: 4.31 MIL/uL (ref 4.22–5.81)
RDW: 15.3 % (ref 11.5–15.5)
WBC: 3.9 10*3/uL — ABNORMAL LOW (ref 4.0–10.5)
nRBC: 0 % (ref 0.0–0.2)

## 2020-03-17 LAB — GLUCOSE, CAPILLARY
Glucose-Capillary: 192 mg/dL — ABNORMAL HIGH (ref 70–99)
Glucose-Capillary: 183 mg/dL — ABNORMAL HIGH (ref 70–99)

## 2020-03-17 LAB — COMPREHENSIVE METABOLIC PANEL
ALT: 67 U/L — ABNORMAL HIGH (ref 0–44)
AST: 64 U/L — ABNORMAL HIGH (ref 15–41)
Albumin: 2.5 g/dL — ABNORMAL LOW (ref 3.5–5.0)
Alkaline Phosphatase: 93 U/L (ref 38–126)
Anion gap: 8 (ref 5–15)
BUN: 23 mg/dL (ref 8–23)
CO2: 27 mmol/L (ref 22–32)
Calcium: 8.6 mg/dL — ABNORMAL LOW (ref 8.9–10.3)
Chloride: 105 mmol/L (ref 98–111)
Creatinine, Ser: 1.03 mg/dL (ref 0.61–1.24)
GFR, Estimated: >60 mL/min (ref >60)
Glucose, Bld: 218 mg/dL — ABNORMAL HIGH (ref 70–99)
Potassium: 3.9 mmol/L (ref 3.5–5.1)
Sodium: 140 mmol/L (ref 135–145)
Total Bilirubin: 1.1 mg/dL (ref 0.3–1.2)
Total Protein: 4.9 g/dL — ABNORMAL LOW (ref 6.5–8.1)

## 2020-03-17 LAB — D-DIMER, QUANTITATIVE: D-Dimer, Quant: 2.85 ug/mL-FEU — ABNORMAL HIGH (ref 0.00–0.50)

## 2020-03-17 LAB — C-REACTIVE PROTEIN: CRP: 0.5 mg/dL (ref <1.0)

## 2020-03-17 MED ORDER — INSULIN ASPART 100 UNIT/ML ~~LOC~~ SOLN: SUBCUTANEOUS | 0 refills | Status: AC

## 2020-03-17 MED ORDER — METOPROLOL SUCCINATE ER 100 MG PO TB24: 100.0000 mg | ORAL_TABLET | Freq: Every day | ORAL | Status: DC

## 2020-03-17 MED ORDER — ALBUTEROL SULFATE HFA 108 (90 BASE) MCG/ACT IN AERS: 2.0000 | INHALATION_SPRAY | Freq: Four times a day (QID) | RESPIRATORY_TRACT | Status: AC

## 2020-03-17 MED ORDER — TAMSULOSIN HCL 0.4 MG PO CAPS: 0.4000 mg | ORAL_CAPSULE | Freq: Every day | ORAL | Status: DC

## 2020-03-17 MED ORDER — INSULIN GLARGINE 100 UNIT/ML ~~LOC~~ SOLN: 10.0000 [IU] | Freq: Every day | SUBCUTANEOUS | 0 refills | Status: AC

## 2020-03-17 MED ORDER — AMLODIPINE BESYLATE 10 MG PO TABS: 10.0000 mg | ORAL_TABLET | Freq: Every day | ORAL | Status: AC

## 2020-03-17 NOTE — Discharge Summary (Addendum)
DEAUNDRE ALLSTON [name]:[id number] DOB: Jun 24, 1926 DOA: 03/06/2020  PCP: Joe [name], No Pcp Per  Admit date: 03/06/2020  Discharge date: 03/17/2020  Admitted From: Home   Disposition:  SNF   Recommendations for Outpatient Follow-up:   Follow up with PCP in 1-2 weeks  PCP Please obtain BMP/CBC, 2 view CXR in 1week,  (see Discharge instructions)   PCP Please follow up on the following pending results:    Home Health: None   Equipment/Devices: None  Consultations: Pall.Care Discharge Condition: Fair CODE STATUS: DNR Diet Recommendation:  Diet Order            DIET - DYS 1 Room service appropriate? Yes with Assist; Fluid consistency: Nectar Thick  Diet effective now                  Chief Complaint  Joe [name] presents with  . Emesis     Brief history of present illness from the day of admission and additional interim summary    Joe [name] is a 87 y.o. male with PMHx of HTN, DM-2, solitary kidney-presenting with confusion/nausea and vomiting-found to have acute metabolic encephalopathy, acute hypoxic respiratory failure (6 L HFNC), DKA due to COVID-19 infection.  Joe [name] was started on IV insulin/steroids/Remdesivir-given 1 dose of Actemra-clinically improved-further hospital course now complicated by delirium/poor oral intake and failure to thrive syndrome.   Significant Events: 2/5>> Admit to [facility] for sepsis/hypoxia (on 6 L of HFNC)-due to COVID-19, AKI and DKA  Significant studies: 2/4>> chest x-ray: Left base infiltrate 2/5>>Chest x-ray: Bilateral multifocal pneumonia 2/5>> bilateral lower extremity Doppler: No DVT.  COVID-19 medications: Steroids: 2/4>>2/10 Remdesivir: 2/4>> 2/8 Actemra: X1 on 2/4                                                                 Hospital Course   Acute Hypoxic Resp  Failure due to Covid 19 Viral pneumonia +/-bacterial pneumonia: Remains stable-on 2 L of oxygen this morning-has completed a course of steroids/Remdesivir/Actemra and antimicrobial therapy.  Overall pulmonary status is much improved except for the risk of aspiration which continues due to his encephalopathy.  Continue soft diet with nectar thick liquids with feeding assistance and aspiration precautions at SNF.  Currently on 2 to 3 L nasal cannula oxygen    Recent Labs  Lab 03/11/20 0547 03/11/20 1005 03/12/20 0046 03/14/20 0749 03/15/20 0300 03/16/20 0418 03/16/20 0613 03/17/20 0420  WBC  --    < > 5.3 4.4 4.4  --  3.3* 3.9*  CRP 1.4*  --  1.0*  --  0.6 <0.5  --  0.5  DDIMER 3.41*  --  3.70*  --  3.22* 3.89*  --  2.85*  AST 132*  --  123* 84* 79* 62*  --  64*  ALT 54*  --  63* 72*  71* 65*  --  67*  ALKPHOS 75  --  80 78 103 87  --  93  BILITOT 2.0*  --  1.0 1.3* 1.1 1.3*  --  1.1  ALBUMIN 2.6*  --  2.5* 2.5* 2.5* 2.4*  --  2.5*   < > = values in this interval not displayed.      Elevated D-dimer: Likely due to COVID-19 related inflammation-lower extremity Dopplers negative-D-dimer trending down with prophylactic Lovenox, will be discharged on home dose aspirin to SNF.  Severe Sepsis due to COVID-19 pneumonia +/-bacterial pneumonia: Sepsis physiology has resolved-cultures negative so far-has completed a course of antimicrobial therapy.  Acute metabolic encephalopathy: Multifactorial-from sepsis/hypoxemia/DKA/steroid use-has significant hearing impairment at baseline-probably has some amount of cognitive dysfunction as well.  Currently mildly encephalopathy getting as needed Haldol here, can be used as needed at SNF as well if needed.  Dysphagia: Due to severe deconditioning-probably and encephalopathy, seen by speech currently on soft diet with nectar thick liquids, needs full feeding assistance and aspiration precautions at SNF.  Acute kidney injury: Likely hemodynamically  mediated-has resolved.  Has congenital solitary kidney.  AKI has resolved.  Hypernatremia: Resolved with hypotonic saline.  Secondary to poor oral intake.  Hypomagnesemia: Repleted  HTN: BP stable-continue metoprolol and amlodipine.     Urinary retention - Flomax, foley.  Foley was placed 2 days ago will discharge with Foley, attempt to remove Foley at SNF on 03/19/2020 once he has at least 5 days of Flomax in the system.  Insulin-dependent DM-2 (A1c 8.7 on 2/4):  Was in DKA when he arrived was treated with DKA protocol, now on sliding scale, at home was on Glucotrol and Glucophage, it will be safer for him to be discharged on Lantus along with sliding scale as his oral intake is still questionable.  Check glucose before every meal at bedtime and adjust as needed.  Deconditioning/debility failure to thrive syndrome: Deconditioned-much more weaker than usual per daughter will discharge to SNF, goal of care now is to keep him comfortable on medical treatment, if any further significant decline then palliative care/hospice   Hard of hearing supportive care.  Obesity: BMI of 30.  Follow with PCP for     Discharge diagnosis     Principal Problem:   Pneumonia due to COVID-19 virus Active Problems:   Acute respiratory failure with hypoxemia (HCC)   Severe sepsis ([place])   DKA, type 2 ([place])   AKI (acute kidney injury) ([place])   DNR (do not resuscitate)    Discharge instructions    Discharge Instructions    Discharge instructions   Complete by: As directed    Follow with Primary MD in 7 days   Get CBC, CMP, 2 view Chest X ray -  checked next visit within 1 week by Primary MD or SNF MD   Activity: As tolerated with Full fall precautions use walker/cane & assistance as needed  Disposition SNF  Diet: Soft diet - nectar thick liquids with feeding assistance and aspiration precautions.  Check blood sugar QAC- HS.  Special Instructions: If you have smoked or chewed Tobacco  in  the last 2 yrs please stop smoking, stop any regular Alcohol  and or any Recreational drug use.  On your next visit with your primary care physician please Get Medicines reviewed and adjusted.  Please request your Prim.MD to go over all Hospital Tests and Procedure/Radiological results at the follow up, please get all Hospital records sent to your Prim MD by signing  hospital release before you go home.  If you experience worsening of your admission symptoms, develop shortness of breath, life threatening emergency, suicidal or homicidal thoughts you must seek medical attention immediately by calling 911 or calling your MD immediately  if symptoms less severe.  You Must read complete instructions/literature along with all the possible adverse reactions/side effects for all the Medicines you take and that have been prescribed to you. Take any new Medicines after you have completely understood and accpet all the possible adverse reactions/side effects.   Increase activity slowly   Complete by: As directed       Discharge Medications   Allergies as of 03/17/2020   No Known Allergies     Medication List    STOP taking these medications   glipiZIDE 5 MG tablet Commonly known as: GLUCOTROL   lisinopril 5 MG tablet Commonly known as: ZESTRIL   metFORMIN 1000 MG tablet Commonly known as: GLUCOPHAGE     TAKE these medications   albuterol 108 (90 Base) MCG/ACT inhaler Commonly known as: VENTOLIN HFA Inhale 2 puffs into the lungs every 6 (six) hours.   amLODipine 10 MG tablet Commonly known as: NORVASC Take 1 tablet (10 mg total) by mouth daily. Start taking on: March 18, 2020   aspirin 81 MG EC tablet Take 81 mg by mouth daily. Swallow whole.   docusate sodium 100 MG capsule Commonly known as: COLACE Take 100 mg by mouth every evening.   insulin aspart 100 UNIT/ML injection Commonly known as: NovoLOG Substitute to any brand approved.Before each meal 3 times a day, 140-199 -  2 units, 200-250 - 4 units, 251-299 - 6 units,  300-349 - 8 units,  350 or above 10 units. Dispense syringes and needles as needed, Ok to switch to PEN if approved. DX DM2, Code E11.65   insulin glargine 100 UNIT/ML injection Commonly known as: Lantus Inject 0.1 mLs (10 Units total) into the skin at bedtime. Dispense insulin pen if approved, if not dispense as needed syringes and needles for 1 month supply. Can switch to Levemir. Diagnosis E 11.65.   metoprolol succinate 100 MG 24 hr tablet Commonly known as: TOPROL-XL Take 1 tablet (100 mg total) by mouth daily. What changed:   medication strength  how much to take   tamsulosin 0.4 MG Caps capsule Commonly known as: FLOMAX Take 1 capsule (0.4 mg total) by mouth daily. Start taking on: March 18, 2020   Vitamin B12 1000 MCG Tbcr Take 1 tablet by mouth every evening.         Major procedures and Radiology Reports - PLEASE review detailed and final reports thoroughly  -       DG Chest Port 1 View  Result Date: 03/06/2020 CLINICAL DATA:  Questionable sepsis EXAM: PORTABLE CHEST 1 VIEW COMPARISON:  10/30/2006 FINDINGS: Infiltrate at the left lung base. Accentuation of markings at the right base which may be atelectasis in the setting of low lung volumes. Cardiomegaly which is stable. No pulmonary edema, effusion, or pneumothorax. IMPRESSION: Low volume chest with infiltrate at the left base. Electronically Signed   By: Monte Fantasia M.D.   On: 03/06/2020 04:13   DG Chest Port 1V same Day  Result Date: 03/07/2020 CLINICAL DATA:  Weakness. EXAM: PORTABLE CHEST 1 VIEW COMPARISON:  March 06, 2020. FINDINGS: Stable cardiomediastinal silhouette. Patchy airspace opacities are noted bilaterally consistent with multifocal pneumonia. No pneumothorax pleural effusion is noted. Bony thorax is unremarkable. IMPRESSION: Bilateral multifocal pneumonia. Electronically Signed  By: Marijo Conception M.D.   On: 03/07/2020 09:24   DG Swallowing  Func-Speech Pathology  Result Date: 03/13/2020 Objective Swallowing Evaluation: Type of Study: MBS-Modified Barium Swallow Study  Joe [name] Details Name: Joe [name] MRN: [id number] Date of Birth: 01-20-1927 Today's Date: 03/13/2020 Time: SLP Start Time (ACUTE ONLY): 1410 -SLP Stop Time (ACUTE ONLY): 1430 SLP Time Calculation (min) (ACUTE ONLY): 20 min Past Medical History: Past Medical History: Diagnosis Date . Diabetes mellitus without complication ([place])  . Hypertension  . Solitary kidney, congenital  Past Surgical History: No past surgical history on file. HPI: Pt is a 87 y.o. male with medical history significant of hypertension, diabetes mellitus type II, and solitary kidney who presented with complaints of weakness with nausea and vomiting and was found to have found to have acute metabolic encephalopathy, acute hypoxic respiratory failure, DKA due to COVID-19 infection. CXR 2/5: Bilateral multifocal pneumonia. Palliative care consulted 2/9  No data recorded Assessment / Plan / Recommendation CHL IP CLINICAL IMPRESSIONS 03/13/2020 Clinical Impression Pt presents with oropharyngeal dysphagia characterized by reduced posterior bolus propulsion, prolonged and inconsistent mastication, reduced bolus cohesion, a pharyngeal delay, reduced lingual retraction, reduced pharyngeal constriction, reduced hyolaryngeal elevation and reduced anterior laryngeal movement. He demonstrated premature spillage to the pyriform sinuses, base of tongue residue, mild vallecular residue, posterior pharyngeal residue, pyriform sinus residue, and inconsistent incomplete epiglottic movement. Pt's swallow was often triggered with at least the head of the liquid bolus at the level of the pyriform sinuses with liquids. This resulted in penetration (PAS 5) and aspiration (PAS 7) of nectar thick liquids via straw and of thin liquids via cup/straw. Aspiration resulted in throat clearing and an occasional cough which was ineffective in  expelling the aspirate. Pt was unable to demonstrate a cough despite prompts due to difficulty following commands. Aspirated material was mobilized superior to the vocal folds with throat clearing and exhilation, but subsequently aspirated again. Per conversation with Dr. Nena Alexander, pt's family would like the pt to remain on a p.o. diet at this time with known aspiration risk. A dysphagia 1 (puree) diet with nectar thick liquids is recommended at this time with strict observance of the swallowing precautions listed below to help mitigate aspiration risk as well as the risk of pulmonary-related complications. Pt is still at risk of aspiration with this diet, but honey thick liquids poses a more significant risk due to pharyngeal residue. SLP will continue to follow pt briefly. SLP Visit Diagnosis Dysphagia, oropharyngeal phase (R13.12) Attention and concentration deficit following -- Frontal lobe and executive function deficit following -- Impact on safety and function Moderate aspiration risk   CHL IP TREATMENT RECOMMENDATION 03/13/2020 Treatment Recommendations Therapy as outlined in treatment plan below   Prognosis 03/13/2020 Prognosis for Safe Diet Advancement Guarded Barriers to Reach Goals Severity of deficits;Time post onset Barriers/Prognosis Comment -- CHL IP DIET RECOMMENDATION 03/13/2020 SLP Diet Recommendations Dysphagia 1 (Puree) solids;Nectar thick liquid Liquid Administration via Cup;No straw Medication Administration Crushed with puree Compensations Slow rate;Small sips/bites;Follow solids with liquid Postural Changes Remain semi-upright after after feeds/meals (Comment)   CHL IP OTHER RECOMMENDATIONS 03/13/2020 Recommended Consults -- Oral Care Recommendations Oral care BID Other Recommendations Order thickener from pharmacy   CHL IP FOLLOW UP RECOMMENDATIONS 03/13/2020 Follow up Recommendations Skilled Nursing facility;24 hour supervision/assistance   CHL IP FREQUENCY AND DURATION 03/13/2020 Speech  Therapy Frequency (ACUTE ONLY) min 2x/week Treatment Duration 2 weeks      CHL IP ORAL PHASE 03/13/2020 Oral Phase Impaired  Oral - Pudding Teaspoon -- Oral - Pudding Cup -- Oral - Honey Teaspoon Reduced posterior propulsion;Weak lingual manipulation;Lingual/palatal residue Oral - Honey Cup Reduced posterior propulsion;Weak lingual manipulation;Lingual/palatal residue Oral - Nectar Teaspoon NT Oral - Nectar Cup Reduced posterior propulsion;Weak lingual manipulation;Lingual/palatal residue Oral - Nectar Straw Reduced posterior propulsion;Weak lingual manipulation;Lingual/palatal residue Oral - Thin Teaspoon -- Oral - Thin Cup Reduced posterior propulsion;Weak lingual manipulation;Lingual/palatal residue Oral - Thin Straw Reduced posterior propulsion;Weak lingual manipulation;Lingual/palatal residue Oral - Puree Reduced posterior propulsion;Weak lingual manipulation;Lingual/palatal residue Oral - Mech Soft Reduced posterior propulsion;Weak lingual manipulation;Lingual/palatal residue;Impaired mastication Oral - Regular -- Oral - Multi-Consistency -- Oral - Pill -- Oral Phase - Comment --  CHL IP PHARYNGEAL PHASE 03/13/2020 Pharyngeal Phase Impaired Pharyngeal- Pudding Teaspoon -- Pharyngeal -- Pharyngeal- Pudding Cup -- Pharyngeal -- Pharyngeal- Honey Teaspoon Reduced tongue base retraction;Pharyngeal residue - valleculae;Pharyngeal residue - pyriform;Reduced anterior laryngeal mobility;Reduced epiglottic inversion;Reduced laryngeal elevation;Reduced airway/laryngeal closure;Delayed swallow initiation-pyriform sinuses;Reduced pharyngeal peristalsis Pharyngeal -- Pharyngeal- Honey Cup Reduced tongue base retraction;Pharyngeal residue - valleculae;Pharyngeal residue - pyriform;Reduced anterior laryngeal mobility;Reduced epiglottic inversion;Reduced laryngeal elevation;Reduced airway/laryngeal closure;Delayed swallow initiation-pyriform sinuses;Reduced pharyngeal peristalsis Pharyngeal -- Pharyngeal- Nectar Teaspoon --  Pharyngeal -- Pharyngeal- Nectar Cup Reduced tongue base retraction;Pharyngeal residue - valleculae;Pharyngeal residue - pyriform;Reduced anterior laryngeal mobility;Reduced epiglottic inversion;Reduced laryngeal elevation;Reduced airway/laryngeal closure;Delayed swallow initiation-pyriform sinuses;Reduced pharyngeal peristalsis Pharyngeal -- Pharyngeal- Nectar Straw Reduced tongue base retraction;Pharyngeal residue - valleculae;Pharyngeal residue - pyriform;Reduced anterior laryngeal mobility;Reduced epiglottic inversion;Reduced laryngeal elevation;Reduced airway/laryngeal closure;Delayed swallow initiation-pyriform sinuses;Reduced pharyngeal peristalsis;Penetration/Aspiration during swallow;Penetration/Apiration after swallow Pharyngeal Material enters airway, passes BELOW cords and not ejected out despite cough attempt by Joe [name];Material enters airway, CONTACTS cords and not ejected out Pharyngeal- Thin Teaspoon -- Pharyngeal -- Pharyngeal- Thin Cup Reduced tongue base retraction;Pharyngeal residue - valleculae;Pharyngeal residue - pyriform;Reduced anterior laryngeal mobility;Reduced epiglottic inversion;Reduced laryngeal elevation;Reduced airway/laryngeal closure;Delayed swallow initiation-pyriform sinuses;Reduced pharyngeal peristalsis;Penetration/Aspiration during swallow;Penetration/Apiration after swallow Pharyngeal Material enters airway, passes BELOW cords and not ejected out despite cough attempt by Joe [name] Pharyngeal- Thin Straw Reduced tongue base retraction;Pharyngeal residue - valleculae;Pharyngeal residue - pyriform;Reduced anterior laryngeal mobility;Reduced epiglottic inversion;Reduced laryngeal elevation;Reduced airway/laryngeal closure;Delayed swallow initiation-pyriform sinuses;Reduced pharyngeal peristalsis;Penetration/Aspiration during swallow;Penetration/Apiration after swallow Pharyngeal Material enters airway, passes BELOW cords and not ejected out despite cough attempt by Joe [name]  Pharyngeal- Puree Reduced tongue base retraction;Pharyngeal residue - valleculae;Pharyngeal residue - pyriform;Reduced anterior laryngeal mobility;Reduced epiglottic inversion;Reduced laryngeal elevation;Reduced airway/laryngeal closure;Delayed swallow initiation-pyriform sinuses;Reduced pharyngeal peristalsis Pharyngeal -- Pharyngeal- Mechanical Soft Reduced tongue base retraction;Pharyngeal residue - valleculae;Pharyngeal residue - pyriform;Reduced anterior laryngeal mobility;Reduced epiglottic inversion;Reduced laryngeal elevation;Reduced airway/laryngeal closure;Delayed swallow initiation-pyriform sinuses;Reduced pharyngeal peristalsis Pharyngeal -- Pharyngeal- Regular -- Pharyngeal -- Pharyngeal- Multi-consistency -- Pharyngeal -- Pharyngeal- Pill -- Pharyngeal -- Pharyngeal Comment --  No flowsheet data found. Shanika I. Hardin Negus, [place], [place] Office number [phone] Pager [phone] Horton Marshall 03/13/2020, 4:12 PM              VAS [place] LOWER EXTREMITY VENOUS (DVT)  Result Date: 03/08/2020  Lower Venous DVT Study Indications: Covid-19, elevated D-Dimer.  Limitations: Joe [name] with altered mental status and constant movement. Comparison Study: No prior study on file Performing Technologist: Sharion Dove RVS  Examination Guidelines: A complete evaluation includes B-mode imaging, spectral Doppler, color Doppler, and power Doppler as needed of all accessible portions of each vessel. Bilateral testing is considered an integral part of a complete examination. Limited examinations for reoccurring indications may be performed as noted. The reflux portion of the exam is performed with the Joe [name] in reverse  Trendelenburg.  +---------+---------------+---------+-----------+----------+-------------------+ RIGHT    CompressibilityPhasicitySpontaneityPropertiesThrombus Aging      +---------+---------------+---------+-----------+----------+-------------------+ CFV      Full            Yes      Yes                                      +---------+---------------+---------+-----------+----------+-------------------+ SFJ      Full                                                             +---------+---------------+---------+-----------+----------+-------------------+ FV Prox  Full                                                             +---------+---------------+---------+-----------+----------+-------------------+ FV Mid   Full                                                             +---------+---------------+---------+-----------+----------+-------------------+ FV DistalFull                                                             +---------+---------------+---------+-----------+----------+-------------------+ PFV      Full                                                             +---------+---------------+---------+-----------+----------+-------------------+ POP      Full           Yes      Yes                                      +---------+---------------+---------+-----------+----------+-------------------+ PTV      Full                                                             +---------+---------------+---------+-----------+----------+-------------------+ PERO                                                  Not well visualized +---------+---------------+---------+-----------+----------+-------------------+   +---------+---------------+---------+-----------+----------+-------------------+ LEFT  CompressibilityPhasicitySpontaneityPropertiesThrombus Aging      +---------+---------------+---------+-----------+----------+-------------------+ CFV      Full           Yes      Yes                                      +---------+---------------+---------+-----------+----------+-------------------+ SFJ      Full                                                              +---------+---------------+---------+-----------+----------+-------------------+ FV Prox  Full           Yes      Yes                                      +---------+---------------+---------+-----------+----------+-------------------+ FV Mid   Full                                                             +---------+---------------+---------+-----------+----------+-------------------+ FV DistalFull                                                             +---------+---------------+---------+-----------+----------+-------------------+ PFV      Full                                                             +---------+---------------+---------+-----------+----------+-------------------+ POP      Full           Yes      Yes                                      +---------+---------------+---------+-----------+----------+-------------------+ PTV      Full                                                             +---------+---------------+---------+-----------+----------+-------------------+ PERO                                                  Not well visualized +---------+---------------+---------+-----------+----------+-------------------+    Summary: RIGHT: - There is no evidence of deep vein thrombosis in the lower extremity. However, portions of  this examination were limited- see technologist comments above.  LEFT: - There is no evidence of deep vein thrombosis in the lower extremity. However, portions of this examination were limited- see technologist comments above.  *See table(s) above for measurements and observations. Electronically signed by Monica Martinez MD on 03/08/2020 at 2:27:10 PM.    Final    [place] EKG SITE RITE  Result Date: 03/08/2020 If Site Rite image not attached, placement could not be confirmed due to current cardiac rhythm.   Micro Results    No results found for this or any previous visit (from the past 240 hour(s)).  Today    Subjective    Estefan Pattison today has no headache,no chest abdominal pain,no new weakness tingling or numbness, feels much better     Objective   Blood pressure (!) 155/75, pulse 84, temperature 97.9 F (36.6 C), temperature source Oral, resp. rate 15, height 5\' 10"  (1.778 m), weight 97.5 kg, SpO2 97 %.   Intake/Output Summary (Last 24 hours) at 03/17/2020 0953 Last data filed at 03/17/2020 0925 Gross per 24 hour  Intake 840 ml  Output 800 ml  Net 40 ml    Exam  Awake but pleasantly confused, moving all 4 extremities by himself, Foley in place [place].AT,PERRAL Supple Neck,No JVD, No cervical lymphadenopathy appriciated.  Symmetrical Chest wall movement, Good air movement bilaterally, CTAB RRR,No Gallops,Rubs or new Murmurs, No Parasternal Heave +ve B.Sounds, Abd Soft, Non tender, No organomegaly appriciated, No rebound -guarding or rigidity. No Cyanosis, Clubbing or edema, No new Rash or bruise   Data Review   CBC w Diff:  Lab Results  Component Value Date   WBC 3.9 (L) 03/17/2020   HGB 13.9 03/17/2020   HCT 41.3 03/17/2020   PLT 182 03/17/2020   LYMPHOPCT 15 03/12/2020   MONOPCT 17 03/12/2020   EOSPCT 5 03/12/2020   BASOPCT 0 03/12/2020    CMP:  Lab Results  Component Value Date   NA 140 03/17/2020   K 3.9 03/17/2020   CL 105 03/17/2020   CO2 27 03/17/2020   BUN 23 03/17/2020   CREATININE 1.03 03/17/2020   PROT 4.9 (L) 03/17/2020   ALBUMIN 2.5 (L) 03/17/2020   BILITOT 1.1 03/17/2020   ALKPHOS 93 03/17/2020   AST 64 (H) 03/17/2020   ALT 67 (H) 03/17/2020  .   Total Time in preparing paper work, data evaluation and todays exam - 52 minutes  Lala Lund M.D on 03/17/2020 at 9:53 AM  [facility]

## 2020-03-17 NOTE — Discharge Instructions (Signed)
Follow with Primary MD in 7 days   Get CBC, CMP, 2 view Chest X ray -  checked next visit within 1 week by Primary MD or SNF MD   Activity: As tolerated with Full fall precautions use walker/cane & assistance as needed  Disposition SNF  Diet: Soft diet - nectar thick liquids with feeding assistance and aspiration precautions.   Check blood sugar QAC- HS.  Special Instructions: If you have smoked or chewed Tobacco  in the last 2 yrs please stop smoking, stop any regular Alcohol  and or any Recreational drug use.  On your next visit with your primary care physician please Get Medicines reviewed and adjusted.  Please request your Prim.MD to go over all Hospital Tests and Procedure/Radiological results at the follow up, please get all Hospital records sent to your Prim MD by signing hospital release before you go home.  If you experience worsening of your admission symptoms, develop shortness of breath, life threatening emergency, suicidal or homicidal thoughts you must seek medical attention immediately by calling 911 or calling your MD immediately  if symptoms less severe.  You Must read complete instructions/literature along with all the possible adverse reactions/side effects for all the Medicines you take and that have been prescribed to you. Take any new Medicines after you have completely understood and accpet all the possible adverse reactions/side effects.

## 2020-03-17 NOTE — Plan of Care (Signed)

## 2020-03-17 NOTE — TOC Transition Note (Signed)
Transition of Care [facility]) - CM/SW Discharge Note   Patient Details  Name: ARMAAN POND MRN: [id number] Date of Birth: 16-Jul-1926  [facility] CM/SW Contact:  [place], [place] Phone Number: 03/17/2020, 10:05 AM   Clinical Narrative:    Patient will DC to: [place] Anticipated DC date: 03/17/2020 Family notified: Yes, daughter Transport by: Corey Harold  Per MD patient ready for DC to [place]. RN to call report prior to discharge [phone]). RN, patient, patient's family, and facility notified of DC. Discharge Summary and FL2 sent to facility. DC packet on chart. Ambulance transport requested for patient.   CSW will sign off for now as social work intervention is no longer needed. Please consult [place] again if new needs arise.      Final next level of care: Skilled Nursing Facility Barriers to Discharge: Barriers Resolved   Patient Goals and CMS Choice Patient states their goals for this hospitalization and ongoing recovery are:: to go to SNF CMS Medicare.gov Compare Post Acute Care list provided to:: Other (Comment Required) (daughter) Choice offered to / list presented to : Adult Children  Discharge Placement PASRR number recieved: 03/17/20 Existing PASRR number confirmed : 03/17/20          Patient chooses bed at: [facility] Patient to be transferred to facility by: [place] Name of family member notified: Patients Daughter, Isaac Dubie [phone] Patient and family notified of of transfer: 03/17/20  Discharge Plan and Services In-house Referral: NA Discharge Planning Services: NA Post Acute Care Choice: [place]          DME Arranged: N/A DME Agency: NA       HH Arranged: NA HH Agency: NA        Social Determinants of Health (SDOH) Interventions     Readmission Risk Interventions No flowsheet data found.

## 2020-03-17 NOTE — Progress Notes (Signed)
Attempted to call [place] facility 3 x's. They are having "phone issues." I left my personal work number with the receptionist Jan and asked her to have the nurse call me for report. Waiting on response.

## 2020-03-17 NOTE — Progress Notes (Signed)
PT Cancellation Note  Patient Details Name: Joe [name] MRN: [id number] DOB: [birth year]   Cancelled Treatment:    Reason Eval/Treat Not Completed: Other (comment) - patient to d/c to SNF today, will defer treatment.  Mabeline Caras, PT, DPT Acute Rehabilitation Services  Pager [phone] Office [place] 03/17/2020, 10:59 AM

## 2020-05-12 ENCOUNTER — Non-Acute Institutional Stay: Payer: Self-pay | Admitting: Student

## 2020-05-12 ENCOUNTER — Other Ambulatory Visit: Payer: Self-pay

## 2020-05-12 DIAGNOSIS — R634 Abnormal weight loss: Secondary | ICD-10-CM

## 2020-05-12 DIAGNOSIS — G9341 Metabolic encephalopathy: Secondary | ICD-10-CM

## 2020-05-12 DIAGNOSIS — Z515 Encounter for palliative care: Secondary | ICD-10-CM

## 2020-05-12 DIAGNOSIS — R1310 Dysphagia, unspecified: Secondary | ICD-10-CM

## 2020-05-12 DIAGNOSIS — J9601 Acute respiratory failure with hypoxia: Secondary | ICD-10-CM

## 2020-05-12 NOTE — Progress Notes (Signed)
[facility] Consult Note Telephone: [phone]  Fax: [phone]    Date of encounter: 05/12/20 PATIENT NAME: [place] [id number]   [phone] (home)  DOB: [birth year] MRN: [id number] PRIMARY CARE PROVIDER:    Dr. Keenan Bachelor  REFERRING PROVIDER:   Dr. Keenan Bachelor  RESPONSIBLE PARTY:    Contact Information    Name Relation Home Work Mobile   Jasraj, [place] Daughter [phone]  [phone]   Leandra Kern Daughter   Miltona, Storey Granddaughter   [phone]       I met face to face with patient and family in the facility. Palliative Care was asked to follow this patient by consultation request of  Dr. Keenan Bachelor to address advance care planning and complex medical decision making. This is the initial visit.                                     ASSESSMENT AND PLAN / RECOMMENDATIONS:   Advance Care Planning/Goals of Care: Goals include to maximize quality of life and symptom management. Our advance care planning conversation included a discussion about:     The value and importance of advance care planning   Experiences with loved ones who have been seriously ill or have died   Exploration of personal, cultural or spiritual beliefs that might influence medical decisions   Exploration of goals of care in the event of a sudden injury or illness   Identification and preparation of a healthcare agent   Review and updating or creation of an  advance directive document   Decision not to resuscitate or to de-escalate disease focused treatments due to poor prognosis.  CODE STATUS: DNR   Message left for patient's daughter, Riley Hallum to continue discussion on advanced care planning and goals of care. Awaiting return call.  Symptom Management/Plan:   Hypoxic respiratory failure due to Covid-19 infection, pneumonia-improved; patient stable, now on 2lpm. Continue oxygen as  directed.  Metabolic encephalopathy secondary recent sepsis, DKA. Patient appears to be stable, now at baseline. Patient does have dysphagia related to encephalopathy;continue mechanical soft diet with ground meats, NTL. May have 233m of water/juice TID. No worsening confusion, agitation or  behaviors reported. Requires assistance with all adl's. Patient has completed OT/PT/ST services; staff to continue assisting with daily care needs.  Appetite/abnormal weight loss-patient with poor appetite; 13 pound weight loss since facility admission. Continue Medpass BID, magic cup BID. Mechanical soft diet, ground meats, NTL.May have thin liquids (water or juice) 2462mTID. Aspiration precautions. Continue routine weights.    Follow up Palliative Care Visit: Palliative care will continue to follow for complex medical decision making, advance care planning, and clarification of goals. Return in 4 weeks or prn.  I spent 45 minutes providing this consultation. More than 50% of the time in this consultation was spent in counseling and care coordination.    PPS: 30%  HOSPICE ELIGIBILITY/DIAGNOSIS: TBD  Chief Complaint: Palliative Medicine initial consult.  HISTORY OF PRESENT ILLNESS:  WaGAILEN VENNEs a [age].o. year old male  with acute metabolic encephalopathy, acute hypoxic respiratory failure, hypertension, T2DM, solitary kidney, DKA.   [facility]. Patient is dependent for all adl's. Poor appetite per staff. He is mostly in bed per staff; non-ambulatory. Wears oxygen at 2lpm via  nasal canula. He denies shortness of breath at rest. He denies pain. Patient with DTI to left heel; daily dressing changes per staff.   History obtained from review of EMR, discussion with primary team, and interview with family, facility staff/caregiver and/or Mr. Dansby.  I reviewed available labs, medications, imaging, studies and related  documents from the EMR.  Records reviewed and summarized above.   ROS  General: NAD EYES: denies vision changes, wears glasses ENMT: dysphagia Cardiovascular: denies chest pain, denies DOE Pulmonary: denies cough, increased SOB w/exertion Abdomen: poor appetite, denies constipation, bowel incontinence GU: denies dysuria, incontinence of urine MSK: weakness,  no falls reported Skin: wound to left heel Neurological: denies pain, denies insomnia Psych: Endorses stable mood Heme/lymph/immuno: denies bruises, abnormal bleeding  Physical Exam: Weights: 2/17 210 pounds, 3/14 202 pounds, 4/4 197 pounds. Constitutional: NAD General: frail appearing EYES: anicteric sclera, lids intact, no discharge  ENMT: hard of hearing, oral mucous membranes moist, dentition intact CV: S1S2, RRR, no LE edema Pulmonary: LCTA, no increased work of breathing, no cough, sats 97% at 2lpm Abdomen: intake 25%, normo-active BS + 4 quadrants, soft and non tender GU: deferred MSK: mild sarcopenia, moves all extremities, non-ambulatory Skin: cool and dry, DTI to left heel; foam dressing in place Neuro:  no generalized weakness, forgetful Psych: non-anxious affect, A and O x 2 Hem/lymph/immuno: no widespread bruising   CURRENT PROBLEM LIST:  Patient Active Problem List   Diagnosis Date Noted  . Pneumonia due to COVID-19 virus 03/06/2020  . Acute respiratory failure with hypoxemia ([place]) 03/06/2020  . Severe sepsis ([place]) 03/06/2020  . DKA, type 2 ([place]) 03/06/2020  . AKI (acute kidney injury) ([place]) 03/06/2020  . DNR (do not resuscitate) 03/06/2020   PAST MEDICAL HISTORY:  Active Ambulatory Problems    Diagnosis Date Noted  . Pneumonia due to COVID-19 virus 03/06/2020  . Acute respiratory failure with hypoxemia ([place]) 03/06/2020  . Severe sepsis ([place]) 03/06/2020  . DKA, type 2 ([place]) 03/06/2020  . AKI (acute kidney injury) ([place]) 03/06/2020  . DNR (do not resuscitate) 03/06/2020   Resolved Ambulatory  Problems    Diagnosis Date Noted  . No Resolved Ambulatory Problems   Past Medical History:  Diagnosis Date  . Diabetes mellitus without complication ([place])   . Hypertension   . Solitary kidney, congenital    SOCIAL HX:  Social History   Tobacco Use  . Smoking status: Not on file  . Smokeless tobacco: Not on file  Substance Use Topics  . Alcohol use: Not on file   FAMILY HX: No family history on file.    ALLERGIES: No Known Allergies   PERTINENT MEDICATIONS:  Outpatient Encounter Medications as of 05/12/2020  Medication Sig  . albuterol (VENTOLIN HFA) 108 (90 Base) MCG/ACT inhaler Inhale 2 puffs into the lungs every 6 (six) hours.  Marland Kitchen amLODipine (NORVASC) 10 MG tablet Take 1 tablet (10 mg total) by mouth daily.  Marland Kitchen aspirin 81 MG EC tablet Take 81 mg by mouth daily. Swallow whole.  . Cyanocobalamin (VITAMIN B12) 1000 MCG TBCR Take 1 tablet by mouth every evening.  . docusate sodium (COLACE) 100 MG capsule Take 100 mg by mouth every evening.  . insulin aspart (NOVOLOG) 100 UNIT/ML injection Substitute to any brand approved.Before each meal 3 times a day, 140-199 - 2 units, 200-250 - 4 units, 251-299 - 6 units,  300-349 - 8 units,  350 or above 10 units. Dispense syringes and needles as needed, Ok to switch to PEN if  approved. DX DM2, Code E11.65  . insulin glargine (LANTUS) 100 UNIT/ML injection Inject 0.1 mLs (10 Units total) into the skin at bedtime. Dispense insulin pen if approved, if not dispense as needed syringes and needles for 1 month supply. Can switch to Levemir. Diagnosis E 11.65.  . metoprolol succinate (TOPROL-XL) 100 MG 24 hr tablet Take 1 tablet (100 mg total) by mouth daily.  . tamsulosin (FLOMAX) 0.4 MG CAPS capsule Take 1 capsule (0.4 mg total) by mouth daily.   No facility-administered encounter medications on file as of 05/12/2020.    Thank you for the opportunity to participate in the care of Mr. Narramore.  The palliative care team will continue to follow. Please  call our office at [phone] if we can be of additional assistance.   Ezekiel Slocumb, NP   COVID-19 PATIENT SCREENING TOOL Asked and negative response unless otherwise noted:   Have you had symptoms of covid, tested positive or been in contact with someone with symptoms/positive test in the past 5-10 days? No

## 2020-05-22 ENCOUNTER — Other Ambulatory Visit: Payer: Self-pay

## 2020-05-22 ENCOUNTER — Observation Stay (HOSPITAL_COMMUNITY)
Admission: EM | Admit: 2020-05-22 | Discharge: 2020-05-23 | Disposition: A | Discharge status: Home / Self Care | Payer: Medicare Other | Attending: Internal Medicine | Admitting: Internal Medicine

## 2020-05-22 ENCOUNTER — Encounter (HOSPITAL_COMMUNITY): Payer: Self-pay

## 2020-05-22 ENCOUNTER — Emergency Department (HOSPITAL_COMMUNITY): Payer: Medicare Other

## 2020-05-22 DIAGNOSIS — R55 Syncope and collapse: Principal | ICD-10-CM | POA: Insufficient documentation

## 2020-05-22 DIAGNOSIS — Z794 Long term (current) use of insulin: Secondary | ICD-10-CM | POA: Diagnosis not present

## 2020-05-22 DIAGNOSIS — Z8616 Personal history of COVID-19: Secondary | ICD-10-CM | POA: Insufficient documentation

## 2020-05-22 DIAGNOSIS — L8990 Pressure ulcer of unspecified site, unspecified stage: Secondary | ICD-10-CM | POA: Insufficient documentation

## 2020-05-22 DIAGNOSIS — E860 Dehydration: Secondary | ICD-10-CM | POA: Insufficient documentation

## 2020-05-22 DIAGNOSIS — Z7982 Long term (current) use of aspirin: Secondary | ICD-10-CM | POA: Diagnosis not present

## 2020-05-22 DIAGNOSIS — L89151 Pressure ulcer of sacral region, stage 1: Secondary | ICD-10-CM | POA: Diagnosis not present

## 2020-05-22 DIAGNOSIS — I10 Essential (primary) hypertension: Secondary | ICD-10-CM | POA: Insufficient documentation

## 2020-05-22 DIAGNOSIS — E119 Type 2 diabetes mellitus without complications: Secondary | ICD-10-CM | POA: Diagnosis not present

## 2020-05-22 DIAGNOSIS — Z79899 Other long term (current) drug therapy: Secondary | ICD-10-CM | POA: Diagnosis not present

## 2020-05-22 HISTORY — DX: Cognitive communication deficit: R41.841

## 2020-05-22 HISTORY — DX: Retention of urine, unspecified: R33.9

## 2020-05-22 HISTORY — DX: Other abnormalities of gait and mobility: R26.89

## 2020-05-22 HISTORY — DX: COVID-19: U07.1

## 2020-05-22 HISTORY — DX: Muscle weakness (generalized): M62.81

## 2020-05-22 HISTORY — DX: Adult failure to thrive: R62.7

## 2020-05-22 HISTORY — DX: Dysphagia, unspecified: R13.10

## 2020-05-22 HISTORY — DX: Acute respiratory failure with hypoxia: J96.01

## 2020-05-22 LAB — I-STAT CHEM 8, ED
BUN: 13 mg/dL (ref 8–23)
Calcium, Ion: 1.1 mmol/L — ABNORMAL LOW (ref 1.15–1.40)
Chloride: 101 mmol/L (ref 98–111)
Creatinine, Ser: 0.6 mg/dL — ABNORMAL LOW (ref 0.61–1.24)
Glucose, Bld: 263 mg/dL — ABNORMAL HIGH (ref 70–99)
HCT: 39 % (ref 39.0–52.0)
Hemoglobin: 13.3 g/dL (ref 13.0–17.0)
Potassium: 3.8 mmol/L (ref 3.5–5.1)
Sodium: 138 mmol/L (ref 135–145)
TCO2: 25 mmol/L (ref 22–32)

## 2020-05-22 LAB — I-STAT VENOUS BLOOD GAS, ED
Acid-Base Excess: 0 mmol/L (ref 0.0–2.0)
Bicarbonate: 26.5 mmol/L (ref 20.0–28.0)
Calcium, Ion: 1.14 mmol/L — ABNORMAL LOW (ref 1.15–1.40)
HCT: 39 % (ref 39.0–52.0)
Hemoglobin: 13.3 g/dL (ref 13.0–17.0)
O2 Saturation: 100 %
Potassium: 3.8 mmol/L (ref 3.5–5.1)
Sodium: 139 mmol/L (ref 135–145)
TCO2: 28 mmol/L (ref 22–32)
pCO2, Ven: 48.7 mmHg (ref 44.0–60.0)
pH, Ven: 7.344 (ref 7.250–7.430)
pO2, Ven: 194 mmHg — ABNORMAL HIGH (ref 32.0–45.0)

## 2020-05-22 LAB — TROPONIN I (HIGH SENSITIVITY)
Troponin I (High Sensitivity): 7 ng/L (ref <18)
Troponin I (High Sensitivity): 7 ng/L (ref <18)

## 2020-05-22 LAB — URINALYSIS, ROUTINE W REFLEX MICROSCOPIC
Bilirubin Urine: NEGATIVE
Glucose, UA: NEGATIVE mg/dL
Hgb urine dipstick: NEGATIVE
Ketones, ur: 20 mg/dL — AB
Nitrite: POSITIVE — AB
Protein, ur: 30 mg/dL — AB
Specific Gravity, Urine: 1.015 (ref 1.005–1.030)
pH: 6 (ref 5.0–8.0)

## 2020-05-22 LAB — COMPREHENSIVE METABOLIC PANEL
ALT: 14 U/L (ref 0–44)
AST: 23 U/L (ref 15–41)
Albumin: 2.3 g/dL — ABNORMAL LOW (ref 3.5–5.0)
Alkaline Phosphatase: 162 U/L — ABNORMAL HIGH (ref 38–126)
Anion gap: 9 (ref 5–15)
BUN: 13 mg/dL (ref 8–23)
CO2: 23 mmol/L (ref 22–32)
Calcium: 8.4 mg/dL — ABNORMAL LOW (ref 8.9–10.3)
Chloride: 105 mmol/L (ref 98–111)
Creatinine, Ser: 0.75 mg/dL (ref 0.61–1.24)
GFR, Estimated: >60 mL/min (ref >60)
Glucose, Bld: 266 mg/dL — ABNORMAL HIGH (ref 70–99)
Potassium: 3.7 mmol/L (ref 3.5–5.1)
Sodium: 137 mmol/L (ref 135–145)
Total Bilirubin: 0.7 mg/dL (ref 0.3–1.2)
Total Protein: 5.3 g/dL — ABNORMAL LOW (ref 6.5–8.1)

## 2020-05-22 LAB — CBC WITH DIFFERENTIAL/PLATELET
Abs Immature Granulocytes: 0.05 10*3/uL (ref 0.00–0.07)
Basophils Absolute: 0 10*3/uL (ref 0.0–0.1)
Basophils Relative: 0 %
Eosinophils Absolute: 0.5 10*3/uL (ref 0.0–0.5)
Eosinophils Relative: 6 %
HCT: 37.9 % — ABNORMAL LOW (ref 39.0–52.0)
Hemoglobin: 12.3 g/dL — ABNORMAL LOW (ref 13.0–17.0)
Immature Granulocytes: 1 %
Lymphocytes Relative: 11 %
Lymphs Abs: 1 10*3/uL (ref 0.7–4.0)
MCH: 31.2 pg (ref 26.0–34.0)
MCHC: 32.5 g/dL (ref 30.0–36.0)
MCV: 96.2 fL (ref 80.0–100.0)
Monocytes Absolute: 0.9 10*3/uL (ref 0.1–1.0)
Monocytes Relative: 11 %
Neutro Abs: 6.2 10*3/uL (ref 1.7–7.7)
Neutrophils Relative %: 71 %
Platelets: 334 10*3/uL (ref 150–400)
RBC: 3.94 MIL/uL — ABNORMAL LOW (ref 4.22–5.81)
RDW: 14.2 % (ref 11.5–15.5)
WBC: 8.7 10*3/uL (ref 4.0–10.5)
nRBC: 0 % (ref 0.0–0.2)

## 2020-05-22 LAB — LACTIC ACID, PLASMA
Lactic Acid, Venous: 2.6 mmol/L (ref 0.5–1.9)
Lactic Acid, Venous: 1.9 mmol/L (ref 0.5–1.9)

## 2020-05-22 LAB — CBG MONITORING, ED: Glucose-Capillary: 265 mg/dL — ABNORMAL HIGH (ref 70–99)

## 2020-05-22 LAB — LIPASE, BLOOD: Lipase: 25 U/L (ref 11–51)

## 2020-05-22 LAB — BETA-HYDROXYBUTYRIC ACID: Beta-Hydroxybutyric Acid: 0.69 mmol/L — ABNORMAL HIGH (ref 0.05–0.27)

## 2020-05-22 MED ORDER — SODIUM CHLORIDE 0.9 % IV BOLUS
1000.0000 mL | Freq: Once | INTRAVENOUS | Status: AC
  Administered 2020-05-22: 1000 mL via INTRAVENOUS

## 2020-05-22 MED ORDER — ENOXAPARIN SODIUM 40 MG/0.4ML ~~LOC~~ SOLN
40.0000 mg | SUBCUTANEOUS | Status: DC
  Administered 2020-05-22: 40 mg via SUBCUTANEOUS
  Filled 2020-05-22: qty 0.4

## 2020-05-22 MED ORDER — INSULIN GLARGINE 100 UNIT/ML ~~LOC~~ SOLN
10.0000 [IU] | Freq: Every day | SUBCUTANEOUS | Status: DC
  Filled 2020-05-22 (×2): qty 0.1

## 2020-05-22 MED ORDER — ALBUTEROL SULFATE (2.5 MG/3ML) 0.083% IN NEBU
3.0000 mL | INHALATION_SOLUTION | Freq: Three times a day (TID) | RESPIRATORY_TRACT | Status: DC
  Administered 2020-05-22: 3 mL via RESPIRATORY_TRACT
  Filled 2020-05-22: qty 3

## 2020-05-22 MED ORDER — ACETAMINOPHEN 500 MG PO TABS
1000.0000 mg | ORAL_TABLET | Freq: Two times a day (BID) | ORAL | Status: DC
  Administered 2020-05-22 – 2020-05-23 (×3): 1000 mg via ORAL
  Filled 2020-05-22 (×3): qty 2

## 2020-05-22 MED ORDER — DOCUSATE SODIUM 100 MG PO CAPS
100.0000 mg | ORAL_CAPSULE | Freq: Every evening | ORAL | Status: DC
  Administered 2020-05-22: 100 mg via ORAL
  Filled 2020-05-22: qty 1

## 2020-05-22 MED ORDER — BIOTENE DRY MOUTH MT LIQD
15.0000 mL | Freq: Three times a day (TID) | OROMUCOSAL | Status: DC
  Administered 2020-05-22 – 2020-05-23 (×3): 15 mL via OROMUCOSAL

## 2020-05-22 MED ORDER — METOPROLOL SUCCINATE ER 25 MG PO TB24
25.0000 mg | ORAL_TABLET | Freq: Every day | ORAL | Status: DC
  Administered 2020-05-22 – 2020-05-23 (×2): 25 mg via ORAL
  Filled 2020-05-22 (×2): qty 1

## 2020-05-22 MED ORDER — ASPIRIN EC 81 MG PO TBEC
81.0000 mg | DELAYED_RELEASE_TABLET | Freq: Every day | ORAL | Status: DC
  Administered 2020-05-22 – 2020-05-23 (×2): 81 mg via ORAL
  Filled 2020-05-22 (×2): qty 1

## 2020-05-22 MED ORDER — SODIUM CHLORIDE 0.9 % IV SOLN: Freq: Once | INTRAVENOUS | Status: DC

## 2020-05-22 MED ORDER — SODIUM CHLORIDE 0.9% FLUSH
3.0000 mL | Freq: Two times a day (BID) | INTRAVENOUS | Status: DC
  Administered 2020-05-23: 3 mL via INTRAVENOUS

## 2020-05-22 MED ORDER — TAMSULOSIN HCL 0.4 MG PO CAPS
0.4000 mg | ORAL_CAPSULE | ORAL | Status: DC
  Administered 2020-05-23: 0.4 mg via ORAL
  Filled 2020-05-22: qty 1

## 2020-05-22 MED ORDER — LACTATED RINGERS IV SOLN: INTRAVENOUS | Status: AC

## 2020-05-22 NOTE — ED Notes (Signed)
Patient transported to CT 

## 2020-05-22 NOTE — Progress Notes (Addendum)
NEW ADMISSION NOTE  Arrival Method: bed Mental Orientation: oriented to self Telemetry: yes Assessment: Completed Skin: see notes, stage 1 on sacram and left heel, abrasion on anterior left lower leg Iv: left forearm, left hand Pain: 0 Tubes: 1 Safety Measures: Safety Fall Prevention Plan has been given, discussed and signed Admission: Completed [facility] [place] Orientation: Patient has been orientated to the room, unit and staff.  Family: 0  Orders have been reviewed and implemented. Will continue to monitor the patient. Call light has been placed within reach and bed alarm has been activated.  Patient is a poor historian and unable to answer admitting questions  Beatris Ship, RN

## 2020-05-22 NOTE — H&P (Signed)
Date: 05/22/2020               Patient Name:  Joe [name] MRN: [id number]  DOB: 1926-09-20 Age / Sex: 87 y.o., male   PCP: [facility] [place] Service: Internal Medicine Teaching Service         Attending Physician: Dr. Sid Falcon, MD    First Contact: Dr. Shon Baton Pager: [phone]  Second Contact: Dr. Darrick Meigs Pager: [phone]       After Hours (After 5p/  First Contact Pager: [phone]  weekends / holidays): Second Contact Pager: [phone]   Chief Complaint: syncope  History of Present Illness: Joe [name] is a 87 y/o gentleman with history of type II DM, HTN, solitary kidney who presents after witnessed sycnopal event while at a urology appointment.  EMS reports on their arrival there was questionable loss of pulse, but he immediately came to shortly after initiating CPR.  The majority of the history was obtained from his daughter who accompanied him at bedside.  Patient was admitted this past February for severe pneumonia secondary to COVID-19. Family was prepared that he would likely not survive that hospitalization, but his respiratory status stabilized, and he was sent to [facility] for rehab. Despite several weeks at attempting rehab, he did not progress much and transitioned to skilled nursing as opposed to rehab. He is primarily bedbound, and daughter reports that any time they try to get him up his blood pressure drops. He has had several "episodes" where he will briefly become unresponsive. He does not eat or drink much and will occasionally require IVF at [facility]. He is currently being treated for pressure wound on left heel which daughter has been told is healing well. He has a chronic indwelling foley for urinary retention.  His daughter is hopeful to get him home with hospice services in the near future.  Denies current pain, URI symptoms, changes in BMs or urination.   Meds:  Current Meds  Medication Sig  . acetaminophen (TYLENOL)  500 MG tablet Take 1,000 mg by mouth in the morning and at bedtime.  Marland Kitchen albuterol (VENTOLIN HFA) 108 (90 Base) MCG/ACT inhaler Inhale 2 puffs into the lungs every 6 (six) hours. (Patient taking differently: Inhale 2 puffs into the lungs in the morning, at noon, and at bedtime.)  . antiseptic oral rinse (BIOTENE) LIQD 15 mLs by Mouth Rinse route 3 (three) times daily after meals.  Marland Kitchen aspirin 81 MG EC tablet Take 81 mg by mouth daily. Swallow whole.  . Cyanocobalamin (VITAMIN B12) 1000 MCG TBCR Take 1 tablet by mouth every evening.  . docusate sodium (COLACE) 100 MG capsule Take 100 mg by mouth every evening.  . insulin glargine (LANTUS) 100 UNIT/ML injection Inject 0.1 mLs (10 Units total) into the skin at bedtime. Dispense insulin pen if approved, if not dispense as needed syringes and needles for 1 month supply. Can switch to Levemir. Diagnosis E 11.65. (Patient taking differently: Inject 15 Units into the skin at bedtime. Can switch to Levemir. Diagnosis E 11.65.)  . metoprolol succinate (TOPROL-XL) 100 MG 24 hr tablet Take 1 tablet (100 mg total) by mouth daily. (Patient taking differently: Take 25 mg by mouth daily.)  . tamsulosin (FLOMAX) 0.4 MG CAPS capsule Take 1 capsule (0.4 mg total) by mouth daily. (Patient taking differently: Take 0.4 mg by mouth every other day.)     Allergies: Allergies as of 05/22/2020  . (  No Known Allergies)   Past Medical History:  Diagnosis Date  . Acute respiratory failure with hypoxia ([place])   . Adult failure to thrive   . Cognitive communication deficit   . COVID-19   . Diabetes mellitus without complication ([place])   . Dysphagia   . Hypertension   . Muscle weakness (generalized)   . Other abnormalities of gait and mobility   . Solitary kidney, congenital   . Urine retention     Family History: No pertinent family history    Social History: currently resides at [facility], daughter very involved in his care. No tobacco, EtOH or illicit substance  use  Review of Systems: A complete ROS was negative except as per HPI.   Physical Exam: Blood pressure (!) 161/85, pulse 87, temperature (!) 96.1 F (35.6 C), temperature source Rectal, resp. rate 12, height 5\' 10"  (1.778 m), weight 97.5 kg, SpO2 100 %. General: awake, alert, hard of hearing, NAD HEENT: [place]/AT; EOMI. Dry mucous membranes Neck: supple; no thyromegaly CV: RRR; no m/r/g Pulm: normal work of breathing on home 2L. Lungs CTA in anterior fields Abd: BS+; abdomen soft, non-tender, non-distended Neuro: alert and oriented to self only. Non-focal  Ext: warm and well perfused; left foot in unna boot   EKG: personally reviewed my interpretation is NSR without acute ischemic changes   CXR: personally reviewed my interpretation is poor inspiration. No effusion or infiltrate  Assessment & Plan by Problem: Active Problems:   Syncope  Joe [name] is a 87 y/o gentleman with history of type II DM, HTN, solitary kidney, respiratory failure secondary to recent COVID-19 pneumonia on chronic 2L who presents after witnessed sycnopal event while at a urology appointment.   Syncope -based on recurrent issues with orthostasis at SNF, this is most likely the etiology -appears mildly dry on exam, will give gentle IV hydration overnight  -given his frailty and longstanding diabetes, he most likely has underlying autonomic insufficiency -follow-up infectious work-up including cultures and u/a; low suspicion for infection given that he does not have a fever or white count and there has been no reported infectious symptoms -based on limited functional status at baseline and daughter talking through her father's [place], will hold off on extensive work-up and solely treat any obvious reversible cause such as infection or dehydration -EKG without heart block or arhythmia; will continue to monitor on telemetry while he is here  -can likely discharge back to [facility] as early as tomorrow -he is  established with [facility] palliative services who can continue to assist with timing of transition to hospice services   Type II DM -continue home lantus 10 units at bedtime -doesn't appear to be on any sliding scale secondary to poor PO intake  HTN -continue home metorpolol 25 mg  -hold amlodipine 10 mg   DVT ppx: lovenox Diet: Dysphagia 1 CODE: DNR, confirmed with daughter    Dispo: Admit patient to Observation with expected length of stay less than 2 midnights.  SignedDelice Bison, DO 05/22/2020, 1:07 PM  Pager: [phone] After 5pm on weekdays and 1pm on weekends: On Call pager: [phone]

## 2020-05-22 NOTE — ED Provider Notes (Signed)
[facility] EMERGENCY DEPARTMENT Provider Note   CSN: [id number] Arrival date & time:        History Chief Complaint  Patient presents with  . Loss of Consciousness    Joe [name] is a 87 y.o. male.  Patient here after multiple syncopal events while at doctor's office.  Came with EMS he states that he possibly lost pulses during his last syncopal event.  He had CPR for another 2 minutes and had return of spontaneous circulation prior to any medications or defibrillation.  EKG showed that he had normal sinus rhythm.  Blood sugar was elevated.  Recent admission for COVID.  Patient now with normal vitals and on his baseline oxygen.  He is slow to respond to questions.  Caregiver was called on the phone who states that patient had prolonged hospital stay after battle with COVID.  Has been having issues with low blood pressures and positional blood pressure changes.  Patient did not fall or hit his head as he was in the wheelchair when this occurred.  He does not complain of any chest pain abdominal pain shortness of breath but appears sluggish.  The history is provided by the patient, the EMS personnel and a caregiver.  Loss of Consciousness Episode history:  Multiple Most recent episode:  Today Progression:  Resolved Context: sitting down   Witnessed: yes   Relieved by:  Nothing Worsened by:  Nothing Associated symptoms: no chest pain        Past Medical History:  Diagnosis Date  . Acute respiratory failure with hypoxia ([place])   . Adult failure to thrive   . Cognitive communication deficit   . COVID-19   . Diabetes mellitus without complication ([place])   . Dysphagia   . Hypertension   . Muscle weakness (generalized)   . Other abnormalities of gait and mobility   . Solitary kidney, congenital   . Urine retention     Patient Active Problem List   Diagnosis Date Noted  . Pneumonia due to COVID-19 virus 03/06/2020  . Acute respiratory failure with  hypoxemia ([place]) 03/06/2020  . Severe sepsis ([place]) 03/06/2020  . DKA, type 2 ([place]) 03/06/2020  . AKI (acute kidney injury) ([place]) 03/06/2020  . DNR (do not resuscitate) 03/06/2020    History reviewed. No pertinent surgical history.     No family history on file.     Home Medications Prior to Admission medications   Medication Sig Start Date End Date Taking? Authorizing Provider  acetaminophen (TYLENOL) 500 MG tablet Take 1,000 mg by mouth in the morning and at bedtime.   Yes [provider]  albuterol (VENTOLIN HFA) 108 (90 Base) MCG/ACT inhaler Inhale 2 puffs into the lungs every 6 (six) hours. Patient taking differently: Inhale 2 puffs into the lungs in the morning, at noon, and at bedtime. 03/17/20  Yes Thurnell Lose, MD  antiseptic oral rinse (BIOTENE) LIQD 15 mLs by Mouth Rinse route 3 (three) times daily after meals.   Yes [provider]  aspirin 81 MG EC tablet Take 81 mg by mouth daily. Swallow whole.   Yes [provider]  Cyanocobalamin (VITAMIN B12) 1000 MCG TBCR Take 1 tablet by mouth every evening.   Yes [provider]  docusate sodium (COLACE) 100 MG capsule Take 100 mg by mouth every evening.   Yes [provider]  insulin glargine (LANTUS) 100 UNIT/ML injection Inject 0.1 mLs (10 Units total) into the skin at bedtime. Dispense insulin pen  if approved, if not dispense as needed syringes and needles for 1 month supply. Can switch to Levemir. Diagnosis E 11.65. Patient taking differently: Inject 15 Units into the skin at bedtime. Can switch to Levemir. Diagnosis E 11.65. 03/17/20  Yes Thurnell Lose, MD  metoprolol succinate (TOPROL-XL) 100 MG 24 hr tablet Take 1 tablet (100 mg total) by mouth daily. Patient taking differently: Take 25 mg by mouth daily. 03/17/20  Yes Thurnell Lose, MD  tamsulosin (FLOMAX) 0.4 MG CAPS capsule Take 1 capsule (0.4 mg total) by mouth daily. Patient taking differently: Take 0.4 mg by mouth  every other day. 03/18/20  Yes Thurnell Lose, MD  amLODipine (NORVASC) 10 MG tablet Take 1 tablet (10 mg total) by mouth daily. Patient not taking: Reported on 05/22/2020 03/18/20   Thurnell Lose, MD  insulin aspart (NOVOLOG) 100 UNIT/ML injection Substitute to any brand approved.Before each meal 3 times a day, 140-199 - 2 units, 200-250 - 4 units, 251-299 - 6 units,  300-349 - 8 units,  350 or above 10 units. Dispense syringes and needles as needed, Ok to switch to PEN if approved. DX DM2, Code E11.65 03/17/20   Thurnell Lose, MD    Allergies    Patient has no known allergies.  Review of Systems   Review of Systems  Unable to perform ROS: Mental status change  Cardiovascular: Positive for syncope. Negative for chest pain.    Physical Exam Updated Vital Signs  ED Triage Vitals  Enc Vitals Group     BP 05/22/20 0824 124/61     Pulse Rate 05/22/20 0824 78     Resp 05/22/20 0824 19     Temp --      Temp src --      SpO2 05/22/20 0815 98 %     Weight 05/22/20 0828 214 lb 15.2 oz (97.5 kg)     Height 05/22/20 0828 5\' 10"  (1.778 m)     Head Circumference --      Peak Flow --      Pain Score 05/22/20 0826 0     Pain Loc --      Pain Edu? --      Excl. in [place]? --     Physical Exam Vitals and nursing note reviewed.  Constitutional:      Appearance: He is well-developed. He is ill-appearing.  HENT:     Head: Normocephalic and atraumatic.     Nose: Nose normal.     Mouth/Throat:     Mouth: Mucous membranes are dry.  Eyes:     Extraocular Movements: Extraocular movements intact.     Conjunctiva/sclera: Conjunctivae normal.     Comments: Pupils are reactive, left pupil slightly more enlarged than the right pupil with irregular border but otherwise extraocular movements are intact, pupils were equally reactive  Cardiovascular:     Rate and Rhythm: Normal rate and regular rhythm.     Pulses: Normal pulses.     Heart sounds: Normal heart sounds. No murmur  heard.   Pulmonary:     Effort: Pulmonary effort is normal. No respiratory distress.     Breath sounds: Normal breath sounds.  Abdominal:     General: Abdomen is flat.     Palpations: Abdomen is soft.     Tenderness: There is no abdominal tenderness.  Musculoskeletal:        General: No swelling.     Cervical back: Neck supple.  Skin:    General:  Skin is warm and dry.  Neurological:     General: No focal deficit present.     Mental Status: He is alert.     Comments: Patient is able to wiggle his toes bilaterally and lift up both of his arms, no obvious facial droop, is able to identify objects and answer simple questions on exam no obvious dysarthria  Psychiatric:        Mood and Affect: Mood normal.     ED Results / Procedures / Treatments   Labs (all labs ordered are listed, but only abnormal results are displayed) Labs Reviewed  CBC WITH DIFFERENTIAL/PLATELET - Abnormal; Notable for the following components:      Result Value   RBC 3.94 (*)    Hemoglobin 12.3 (*)    HCT 37.9 (*)    All other components within normal limits  COMPREHENSIVE METABOLIC PANEL - Abnormal; Notable for the following components:   Glucose, Bld 266 (*)    Calcium 8.4 (*)    Total Protein 5.3 (*)    Albumin 2.3 (*)    Alkaline Phosphatase 162 (*)    All other components within normal limits  LACTIC ACID, PLASMA - Abnormal; Notable for the following components:   Lactic Acid, Venous 2.6 (*)    All other components within normal limits  CBG MONITORING, ED - Abnormal; Notable for the following components:   Glucose-Capillary 265 (*)    All other components within normal limits  I-STAT CHEM 8, ED - Abnormal; Notable for the following components:   Creatinine, Ser 0.60 (*)    Glucose, Bld 263 (*)    Calcium, Ion 1.10 (*)    All other components within normal limits  I-STAT VENOUS BLOOD GAS, ED - Abnormal; Notable for the following components:   pO2, Ven 194.0 (*)    Calcium, Ion 1.14 (*)     All other components within normal limits  URINE CULTURE  CULTURE, BLOOD (ROUTINE X 2)  CULTURE, BLOOD (ROUTINE X 2)  LIPASE, BLOOD  URINALYSIS, ROUTINE W REFLEX MICROSCOPIC  BETA-HYDROXYBUTYRIC ACID  LACTIC ACID, PLASMA  INFLUENZA PANEL BY PCR (TYPE A & B)  TROPONIN I (HIGH SENSITIVITY)  TROPONIN I (HIGH SENSITIVITY)    EKG EKG Interpretation  Date/Time:  Friday May 22 2020 08:18:21 EDT Ventricular Rate:  84 PR Interval:  212 QRS Duration: 101 QT Interval:  388 QTC Calculation: 459 R Axis:   -4 Text Interpretation: Sinus rhythm Borderline prolonged PR interval Posterior infarct, old Consider anterior infarct Confirmed by Lennice Sites ([id number]) on 05/22/2020 8:22:31 AM   Radiology CT HEAD WO CONTRAST  Result Date: 05/22/2020 CLINICAL DATA:  Mental status changes of unknown cause, nausea, vomiting; history diabetes mellitus, hypertension EXAM: CT HEAD WITHOUT CONTRAST TECHNIQUE: Contiguous axial images were obtained from the base of the skull through the vertex without intravenous contrast. Sagittal and coronal MPR images reconstructed from axial data set. COMPARISON:  12/19/2017 FINDINGS: Brain: Generalized atrophy. Normal ventricular morphology. No midline shift or mass effect. Small vessel chronic ischemic changes of deep cerebral white matter. 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 No intracranial hemorrhage, mass lesion or evidence of acute infarction. No extra-axial fluid collections. Vascular: No hyperdense vessels. Minimal atherosclerotic calcification of internal carotid arteries at skull base Skull: Intact Sinuses/Orbits: Complete opacification of RIGHT mastoid air cells and middle ear cavity unchanged. Mucosal retention cyst LEFT maxillary sinus. Small amount of fluid within a few inferior LEFT mastoid air cells. Other: N/A IMPRESSION: Atrophy with small vessel  chronic ischemic changes of deep cerebral white matter. Old lacunar infarct at posterior LEFT basal  ganglia. No acute intracranial abnormalities. Chronic opacification of RIGHT mastoid air cells and middle ear cavity. Electronically Signed   By: Lavonia Dana M.D.   On: 05/22/2020 09:11   DG Chest Port 1 View  Result Date: 05/22/2020 CLINICAL DATA:  Syncope EXAM: PORTABLE CHEST 1 VIEW COMPARISON:  03/07/2020 FINDINGS: Improved aeration since prior with clearing of the patchy airspace opacities bilaterally. The lungs remain low volume. Mild streaky density at the bases seen since at least 2015. No edema, effusion, or pneumothorax. Normal heart size and mediastinal contours. IMPRESSION: Chronic low lung volumes with scarring/atelectasis at the bases. Electronically Signed   By: Monte Fantasia M.D.   On: 05/22/2020 08:54    Procedures Procedures   Medications Ordered in ED Medications  0.9 %  sodium chloride infusion (has no administration in time range)  sodium chloride 0.9 % bolus 1,000 mL (1,000 mLs Intravenous New Bag/Given 05/22/20 0944)    ED Course  I have reviewed the triage vital signs and the nursing notes.  Pertinent labs & imaging results that were available during my care of the patient were reviewed by me and considered in my medical decision making (see chart for details).    MDM Rules/Calculators/A&P                          Joe [name] is a 87 year old male with history of diabetes, hypertension, recent COVID and urinary retention, mild dementia who presents the ED after loss of consciousness/syncopal event, brief CPR.  Patient arrives with unremarkable vitals.  Appears to be grossly neurologically intact.  He has a misshapen left pupil but suspect that this is some type of surgical pupil does not appear to be a blown pupil and is reactive to light.  We will get a head CT.  He is not on blood thinners.  According to the medical chart he is DNR and I did talk with the daughter on the phone to confirm this.  Recently since being discharged to nursing home he has been having  orthostatic events.  Suspect that might have been what happened today.  EKG shows sinus rhythm.  No ischemic changes.  No heart block.  We will pursue infectious work-up, metabolic work-up to evaluate.  He does have a chronic Foley catheter and could have a UTI.  He was at urology follow-up for that today.  Overall history is difficult to obtain but he is following commands, he is hard of hearing, he appears clinically dehydrated with dry mucous membranes.  He denies any chest pain, shortness of breath, abdominal pain.  He is on 3 L of oxygen which appears to be baseline.  CTA with no evidence of head bleed.  No evidence of lung infection on chest x-ray.  Lactic acid mildly elevated 2.6.  No significant leukocytosis, anemia, electrolyte abnormality otherwise.  Creatinine normal.  Troponin normal.  Orthostatics are unremarkable after liter of fluid.  Overall suspect dehydration possibly causing symptoms today.  Caregiver states that patient has had some syncopal type events while in rehab.  Today questionable that he might of lost pulses.  Overall suspect admission for IV hydration, telemetry, echocardiogram and further syncope work-up is warranted.  Likely would benefit from a palliative care consult as well.  Patient is DNR.  This chart was dictated using voice recognition software.  Despite best efforts to proofread,  errors  can occur which can change the documentation meaning.     Final Clinical Impression(s) / ED Diagnoses Final diagnoses:  Syncope and collapse  Dehydration    Rx / DC Orders ED Discharge Orders    None       Lennice Sites, DO 05/22/20 1054

## 2020-05-22 NOTE — ED Triage Notes (Signed)
Pt arrived via [facility] from [place] doctor office after having 3 syncopal episodes while sitting in wheel chair. Per EMS when they arrived pt was having agonal breathing and they lost pulses so they performed one round of CPR and got ROSC back. Pt is on 2L 02 per [place] at baseline. Pt arrived alert, VSS, NSR on monitor

## 2020-05-22 NOTE — ED Notes (Signed)
Pt's left pupil of eye is misshaped, Dr Ronnald Nian is aware.

## 2020-05-22 NOTE — ED Notes (Signed)
Pt can't sign the MSE, because he is confused and no family is present.

## 2020-05-22 NOTE — ED Notes (Signed)
Family at bedside & updated on patient's status.

## 2020-05-23 DIAGNOSIS — L8990 Pressure ulcer of unspecified site, unspecified stage: Secondary | ICD-10-CM | POA: Insufficient documentation

## 2020-05-23 DIAGNOSIS — R55 Syncope and collapse: Secondary | ICD-10-CM | POA: Diagnosis not present

## 2020-05-23 DIAGNOSIS — E860 Dehydration: Secondary | ICD-10-CM | POA: Diagnosis not present

## 2020-05-23 LAB — CBC WITH DIFFERENTIAL/PLATELET
Abs Immature Granulocytes: 0.02 10*3/uL (ref 0.00–0.07)
Basophils Absolute: 0 10*3/uL (ref 0.0–0.1)
Basophils Relative: 1 %
Eosinophils Absolute: 0.5 10*3/uL (ref 0.0–0.5)
Eosinophils Relative: 9 %
HCT: 34.2 % — ABNORMAL LOW (ref 39.0–52.0)
Hemoglobin: 11.1 g/dL — ABNORMAL LOW (ref 13.0–17.0)
Immature Granulocytes: 0 %
Lymphocytes Relative: 16 %
Lymphs Abs: 0.9 10*3/uL (ref 0.7–4.0)
MCH: 30.2 pg (ref 26.0–34.0)
MCHC: 32.5 g/dL (ref 30.0–36.0)
MCV: 93.2 fL (ref 80.0–100.0)
Monocytes Absolute: 0.7 10*3/uL (ref 0.1–1.0)
Monocytes Relative: 12 %
Neutro Abs: 3.6 10*3/uL (ref 1.7–7.7)
Neutrophils Relative %: 62 %
Platelets: 331 10*3/uL (ref 150–400)
RBC: 3.67 MIL/uL — ABNORMAL LOW (ref 4.22–5.81)
RDW: 14.1 % (ref 11.5–15.5)
WBC: 5.7 10*3/uL (ref 4.0–10.5)
nRBC: 0 % (ref 0.0–0.2)

## 2020-05-23 LAB — BASIC METABOLIC PANEL
Anion gap: 8 (ref 5–15)
BUN: 13 mg/dL (ref 8–23)
CO2: 27 mmol/L (ref 22–32)
Calcium: 8.3 mg/dL — ABNORMAL LOW (ref 8.9–10.3)
Chloride: 102 mmol/L (ref 98–111)
Creatinine, Ser: 0.65 mg/dL (ref 0.61–1.24)
GFR, Estimated: >60 mL/min (ref >60)
Glucose, Bld: 192 mg/dL — ABNORMAL HIGH (ref 70–99)
Potassium: 4 mmol/L (ref 3.5–5.1)
Sodium: 137 mmol/L (ref 135–145)

## 2020-05-23 LAB — GLUCOSE, CAPILLARY
Glucose-Capillary: 189 mg/dL — ABNORMAL HIGH (ref 70–99)
Glucose-Capillary: 198 mg/dL — ABNORMAL HIGH (ref 70–99)

## 2020-05-23 MED ORDER — CHLORHEXIDINE GLUCONATE CLOTH 2 % EX PADS
6.0000 | MEDICATED_PAD | Freq: Every day | CUTANEOUS | Status: DC
  Administered 2020-05-23: 6 via TOPICAL

## 2020-05-23 MED ORDER — METOPROLOL SUCCINATE ER 25 MG PO TB24: 25.0000 mg | ORAL_TABLET | Freq: Every day | ORAL | Status: AC

## 2020-05-23 MED ORDER — TAMSULOSIN HCL 0.4 MG PO CAPS: 0.4000 mg | ORAL_CAPSULE | ORAL | Status: AC

## 2020-05-23 MED ORDER — ALBUTEROL SULFATE (2.5 MG/3ML) 0.083% IN NEBU: 3.0000 mL | INHALATION_SOLUTION | Freq: Four times a day (QID) | RESPIRATORY_TRACT | Status: DC | PRN

## 2020-05-23 NOTE — Discharge Instructions (Signed)
[name],   It was a pleasure taking care of you in the hospital. You were admitted after an episode of passing out while at your urology appointment. We ran some tests, and it is most likely that you were dehydrated. You were given intravenous fluids.  Take care!      Dehydration, Elderly Dehydration is a condition in which there is not enough water or other fluids in the body. This happens when a person loses more fluids than he or she takes in. Important organs, such as the kidneys, brain, and heart, cannot function without a proper amount of fluids. Any loss of fluids from the body can lead to dehydration. People 85 years of age or older have a higher risk of dehydration than younger adults. This is because in older age, the body:  Is less able to maintain the right amount of water.  Does not respond to temperature changes as well.  Does not get a sense of thirst as easily or quickly. Dehydration can be mild, moderate, or severe. It should be treated right away to prevent it from becoming severe. What are the causes? Dehydration may be caused by:  Conditions that cause loss of water or other fluids, such as diarrhea, vomiting, or sweating or urinating a lot.  Not drinking enough fluids, especially when you are ill or doing activities that require a lot of energy.  Other illnesses and conditions, such as fever or infection.  Certain medicines, such as medicines that remove excess fluid from the body (diuretics).  Lack of safe drinking water.  Not being able to get enough water and food. What increases the risk? This condition is more likely to develop in people who:  Have a long-term (chronic) illness, such as: ? An illness that may cause you to urinate more, such as diabetes. ? Kidney, heart, or lung disease that have not been treated properly. ? A disease of the brain and nervous system or a disorder that affects your thinking and emotions, such as dementia.  Are  85 years or older.  Have a disability.  Live in a place that is high in altitude, where thinner, drier air causes you to have more fluid loss. What are the signs or symptoms? Symptoms of dehydration depend on how severe it is. Mild or moderate dehydration  Thirst.  Dry lips or dry mouth.  Dizziness or light-headedness, especially when standing up from a seated position.  Muscle cramps.  Dark urine. Urine may be the color of tea.  Less urine or tears produced than usual.  Headache. Severe dehydration  Changes in skin. Your skin may be cold and clammy, blotchy, or pale. Your skin also may not return to normal after being lightly pinched and released.  Little or no tears, urine, or sweat.  Changes in vital signs, such as rapid breathing and low blood pressure. Your pulse may be weak or may be faster than 100 beats a minute when you are sitting still.  Other changes, such as: ? Feeling very thirsty. ? Sunken eyes. ? Cold hands and feet. ? Confusion. ? Being very tired (lethargic) or having trouble waking from sleep. ? Short-term weight loss. ? Loss of consciousness. How is this diagnosed? This condition is diagnosed based on your symptoms and a physical exam. Blood and urine tests may help confirm the diagnosis. How is this treated? Treatment for this condition depends on how severe it is. Treatment should be started right away. Do not wait until dehydration becomes  severe. Severe dehydration is an emergency and needs to be treated in a hospital.  Mild or moderate dehydration can often be treated at home. You may be asked to: ? Drink more fluids. ? Drink an oral rehydration solution (ORS). This drink helps restore proper amounts of fluids and salts and minerals in the blood (electrolytes).  Severe dehydration can be treated: ? With IV fluids. ? By correcting abnormal levels of electrolytes. This is often done by giving electrolytes through a tube that is passed through  your nose and into your stomach (nasogastric tube, or NG tube). ? By treating the underlying cause of dehydration. Follow these instructions at home: Oral rehydration solution If told by your health care provider, drink an ORS:  Make an ORS by following instructions on the package.  Start by drinking small amounts, about  cup (120 mL) every 5-10 minutes.  Slowly increase how much you drink until you have taken the amount recommended by your health care provider. Eating and drinking  Drink enough clear fluid to keep your urine pale yellow. If you were told to drink an ORS, finish the ORS first and then start slowly drinking other clear fluids. Drink fluids such as: ? Water. Do not drink only water. Doing that can lead to hyponatremia, which is having too little salt (sodium) in the body. ? Water from ice chips you suck on. ? Fruit juice that you have added water to (diluted fruit juice). ? Low-calorie sports drinks.  Eat foods that contain a healthy balance of electrolytes, such as bananas, oranges, potatoes, tomatoes, and spinach.  Do not drink alcohol.  Avoid the following: ? Drinks that contain a lot of sugar. These include high-calorie sports drinks, fruit juice that is not diluted, and soda. ? Caffeine. ? Foods that are greasy or contain a lot of fat or sugar.         General instructions  Take over-the-counter and prescription medicines only as told by your health care provider.  Do not take sodium tablets. This can lead to having too much sodium in the body (hypernatremia).  Return to your normal activities as told by your health care provider. Ask your health care provider what activities are safe for you.  Keep all follow-up visits as told by your health care provider. This is important. Contact a health care provider if you:  Have pain in your abdomen and the pain gets worse or stays in one area (localizes).  Have a rash.  Have a stiff neck.  Are more  irritable than usual.  Are sleepier or have a harder time waking than usual.  Feel weak or dizzy.  Are very thirsty. Get help right away if you have:  Any symptoms of severe dehydration.  A fever.  A severe headache.  Symptoms of vomiting, such as: ? Your vomiting gets worse or does not go away. ? Your vomit includes blood or green matter (bile). ? You cannot eat or drink without vomiting.  Problems with urination or bowel movements, such as: ? Diarrhea that gets worse or does not go away. ? Blood in your stool (feces). This may cause stool to look black and tarry. ? Not urinating, or urinating only a small amount of very dark urine, within 6-8 hours.  Trouble breathing.  Symptoms that get worse with treatment. These symptoms may represent a serious problem that is an emergency. Do not wait to see if the symptoms will go away. Get medical help right away. Call your  local emergency services (911 in the [place] Do not drive yourself to the hospital. Summary  Dehydration is a condition in which there is not enough water or other fluids in the body. This happens when a person loses more fluids than he or she takes in.  Treatment for this condition depends on the severity. Treatment should be started right away. Do not wait until dehydration becomes severe.  Drink enough clear fluid to keep your urine pale yellow. If you were told to drink an oral rehydration solution (ORS), finish the ORS first and then start slowly drinking other clear fluids.  Take over-the-counter and prescription medicines only as told by your health care provider.  Get help right away if you have any symptoms of severe dehydration. This information is not intended to replace advice given to you by your health care provider. Make sure you discuss any questions you have with your health care provider. Document Revised: 08/30/2018 Document Reviewed: 08/30/2018 Elsevier Patient Education  [place].

## 2020-05-23 NOTE — Progress Notes (Signed)
Give report to [facility].

## 2020-05-23 NOTE — Care Management Obs Status (Signed)
[place] NOTIFICATION   Patient Details  Name: Joe [name] MRN: [id number] Date of Birth: 07-22-1926   Medicare Observation Status Notification Given:  Yes    Bartholomew Crews, RN 05/23/2020, 2:41 PM

## 2020-05-23 NOTE — TOC Transition Note (Signed)
Transition of Care [facility]) - CM/SW Discharge Note   Patient Details  Name: SINCLAIR ALLIGOOD MRN: [id number] Date of Birth: Feb 03, 1926  [facility] of [facility]) CM/SW Contact:  Coralee Pesa, [place] Phone Number: 05/23/2020, 12:26 PM   Clinical Narrative:    Pt to be transferred to [facility] via [place], transport set for 1pm. Nurse to call report to Nursing director Tim Lair, [phone]. Rm [phone] on [employer].   Final next level of care: Skilled Nursing Facility Barriers to Discharge: Barriers Resolved   Patient Goals and CMS Choice        Discharge Placement              Patient chooses bed at: [facility] Patient to be transferred to facility by: [facility] Name of family member notified: Ann Patient and family notified of of transfer: 05/23/20  Discharge Plan and Services                                     Social Determinants of Health (SDOH) Interventions     Readmission Risk Interventions No flowsheet data found.

## 2020-05-23 NOTE — Discharge Summary (Signed)
Name: Joe Joe [name] MRN: [id number] DOB: 1926-12-21 87 y.o. PCP: Pc, [facility]  Date of Admission: 05/22/2020  8:14 AM Date of Discharge: 05/23/2020 Attending Physician: Sid Falcon, MD  Discharge Diagnosis:  1. Syncope 2. Dehydration   Discharge Medications: Allergies as of 05/23/2020   No Known Allergies     Medication List    TAKE these medications   acetaminophen 500 MG tablet Commonly known as: TYLENOL Take 1,000 mg by mouth in Joe morning and at bedtime.   albuterol 108 (90 Base) MCG/ACT inhaler Commonly known as: VENTOLIN HFA Inhale 2 puffs into Joe lungs every 6 (six) hours. What changed: when to take this   amLODipine 10 MG tablet Commonly known as: NORVASC Take 1 tablet (10 mg total) by mouth daily.   antiseptic oral rinse Liqd 15 mLs by Mouth Rinse route 3 (three) times daily after meals.   aspirin 81 MG EC tablet Take 81 mg by mouth daily. Swallow whole.   docusate sodium 100 MG capsule Commonly known as: COLACE Take 100 mg by mouth every evening.   insulin aspart 100 UNIT/ML injection Commonly known as: NovoLOG Substitute to any brand approved.Before each meal 3 times a day, 140-199 - 2 units, 200-250 - 4 units, 251-299 - 6 units,  300-349 - 8 units,  350 or above 10 units. Dispense syringes and needles as needed, Ok to switch to PEN if approved. DX DM2, Code E11.65   insulin glargine 100 UNIT/ML injection Commonly known as: Lantus Inject 0.1 mLs (10 Units total) into Joe skin at bedtime. Dispense insulin pen if approved, if not dispense as needed syringes and needles for 1 month supply. Can switch to Levemir. Diagnosis E 11.65. What changed:   how much to take  additional instructions   metoprolol succinate 25 MG 24 hr tablet Commonly known as: TOPROL-XL Take 1 tablet (25 mg total) by mouth daily. Start taking on: May 24, 2020 What changed:   medication strength  how much to take   tamsulosin 0.4 MG Caps  capsule Commonly known as: FLOMAX Take 1 capsule (0.4 mg total) by mouth every other day. Start taking on: May 25, 2020   Vitamin B12 1000 MCG Tbcr Take 1 tablet by mouth every evening.       Disposition and follow-up:   Joe Joe [name] was discharged from [facility] in Stable condition.  At Joe hospital follow up visit please address:  1.    Syncope 2/2 suspected dehydration - Ensure adequate hydration  Stage 1 sacral pressure ulcer - Identified by nursing staff. Examine regularly and treat accordingly.  Goals of care - Joe [name] is established with [facility] palliative services who can continue to assist with timing of transition to hospice services   2.  Labs / imaging needed at time of follow-up: none  3.  Pending labs/ test needing follow-up: urine culture collected 05/22/20 at 11:42 am no growth by time of discharge, blood culture no growth at 24 hrs  Follow-up Appointments:  Follow-up Information    Pc, [facility]. Schedule an appointment as soon as possible for a visit in 1 week(s).   Contact information: [place] [address] [phone]               - follow-up with PCP  Hospital Course by problem list:  Joe Joe [name] is a 87 y/o gentleman with history of type II DM, HTN, solitary kidney, respiratory failure secondary to recent  COVID-19 pneumonia on chronic 2L who presents after witnessed sycnopal event while at a urology appointment.   Syncope 2/2 dehydration EMS reports on their arrival there was questionable loss of pulse, but he immediately came to shortly after initiating CPR. He is primarily bedbound, and daughter reports that any time they try to get him up his blood pressure drops. He has had several "episodes" where he will briefly become unresponsive. He does not eat or drink much and will occasionally require IVF at [facility]. On exam appeared mildly dry so was treated with gentle IV hydration  overnight. Given his frailty and longstanding diabetes, he most likely has underlying autonomic insufficiency. Low suspicion for infection given that he does not have a fever or white count and there has been no reported infectious symptoms. Urinalysis not suggestive of infection. Blood cultures no growth at 24 hours. Urine culture pending at time of discharge but given his chronic indwelling foley for urinary retention it is possible he is colonized. Based on his limited functional status at baseline and daughter talking through her father's [place], extensive work-up was not pursued, and he was treated with IV hydration as above. He is established with [facility] palliative services who can continue to assist with timing of transition to hospice services.   ~~~~~~~~~~~~~~~~~~~~~~~~~~~~~~~~~~~~~~~~~~~~~~~~~~~~~~~~~~~~~~~~~~~~~~~~~~~~~~~~~~~~~~  Day of Discharge Subjective:  No acute events overnight. During evaluation this morning, Joe [name]'s daughter at bedside. Joe [name] sleeping upon my arrival. Daughter reports there were no issues overnight and that Joe Joe [name] asked this morning when he would be going back to [facility]. She notes he has been on 2 L [place] since being at [facility]. Joe [name] is very hard of hearing, aroused to loud voice directed towards his left side (his good ear). He states he is feeling "good." He is oriented to person and place. Says Joe year is 15s. When asked who Joe current president is, he states, "we don't have one." His daughter states he has been saying that because of his political ideology. He cannot recall specific details of passing out yesterday but states he is ready to leave Joe hospital.  Discharge Exam:   BP (!) 147/73 (BP Location: Left Arm)   Pulse 89   Temp 97.6 F (36.4 C) (Oral)   Resp 17   Ht 5\' 10"  (1.778 m)   Wt 95.6 kg   SpO2 97%   BMI 30.24 kg/m  General: sleeping initially however arouses to loud voice, alert, hard of hearing, NAD HEENT:  normocephalic atraumatic, Mucous membranes moist. Neck: supple CV: RRR; no m/r/g Pulm: normal work of breathing on home 2L. Lungs CTA in anterior fields Abd: BS+; abdomen soft, non-tender, non-distended Neuro: alert and oriented to self, place, and partly situation. As above, aware of current president. Unable to recall specific events of yesterday. Ext: warm and well perfused; left foot in unna boot   Pertinent Labs, Studies, and Procedures:   05/22/2020 CT HEAD WO CONTRAST  Result Date: 05/22/2020 CLINICAL DATA:  Mental status changes of unknown cause, nausea, vomiting; history diabetes mellitus, hypertension EXAM: CT HEAD WITHOUT CONTRAST TECHNIQUE: Contiguous axial images were obtained from Joe base of Joe skull through Joe vertex without intravenous contrast. Sagittal and coronal MPR images reconstructed from axial data set. COMPARISON:  12/19/2017 FINDINGS: Brain: Generalized atrophy. Normal ventricular morphology. No midline shift or mass effect. Small vessel chronic ischemic changes of deep cerebral white matter. 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 No intracranial hemorrhage, mass lesion or evidence of acute infarction. No  extra-axial fluid collections. Vascular: No hyperdense vessels. Minimal atherosclerotic calcification of internal carotid arteries at skull base Skull: Intact Sinuses/Orbits: Complete opacification of RIGHT mastoid air cells and middle ear cavity unchanged. Mucosal retention cyst LEFT maxillary sinus. Small amount of fluid within a few inferior LEFT mastoid air cells. Other: N/A IMPRESSION: Atrophy with small vessel chronic ischemic changes of deep cerebral white matter. 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 No acute intracranial abnormalities. Chronic opacification of RIGHT mastoid air cells and middle ear cavity. Electronically Signed   By: Lavonia Dana M.D.   On: 05/22/2020 09:11   05/22/2020 DG Chest Port 1 View  CLINICAL DATA:  Syncope  EXAM: PORTABLE CHEST 1 VIEW COMPARISON:  03/07/2020 FINDINGS: Improved aeration since prior with clearing of Joe patchy airspace opacities bilaterally. Joe lungs remain low volume. Mild streaky density at Joe bases seen since at least 2015. No edema, effusion, or pneumothorax. Normal heart size and mediastinal contours. IMPRESSION: Chronic low lung volumes with scarring/atelectasis at Joe bases. Electronically Signed   By: Monte Fantasia M.D.   On: 05/22/2020 08:54    Discharge Instructions: Discharge Instructions    Call MD for:  difficulty breathing, headache or visual disturbances   Complete by: As directed    Call MD for:  extreme fatigue   Complete by: As directed    Call MD for:  hives   Complete by: As directed    Call MD for:  persistant dizziness or light-headedness   Complete by: As directed    Call MD for:  persistant nausea and vomiting   Complete by: As directed    Call MD for:  severe uncontrolled pain   Complete by: As directed    Call MD for:  temperature >100.4   Complete by: As directed    No wound care   Complete by: As directed      Mr. Square,   It was a pleasure taking care of you in Joe hospital. You were admitted after an episode of passing out while at your urology appointment. We ran some tests, and it is most likely that you were dehydrated. You were given intravenous fluids.  Take care!  Signed: Alexandria Lodge, MD 05/23/2020, 12:20 PM   Pager: [phone]

## 2020-05-23 NOTE — Progress Notes (Signed)
Tried to give report to Marily Lente is going to call me back as she requested.

## 2020-05-25 LAB — URINE CULTURE: Culture: >=100000 — AB

## 2020-05-27 LAB — CULTURE, BLOOD (ROUTINE X 2)
Culture: NO GROWTH
Culture: NO GROWTH

## 2021-05-28 IMAGING — DX DG CHEST 1V PORT SAME DAY
1 series · 1 of 1 positions shown · non-contrast
Comparison: March 06, 2020.

CLINICAL DATA: Weakness.

EXAM:
PORTABLE CHEST 1 VIEW

[chest]
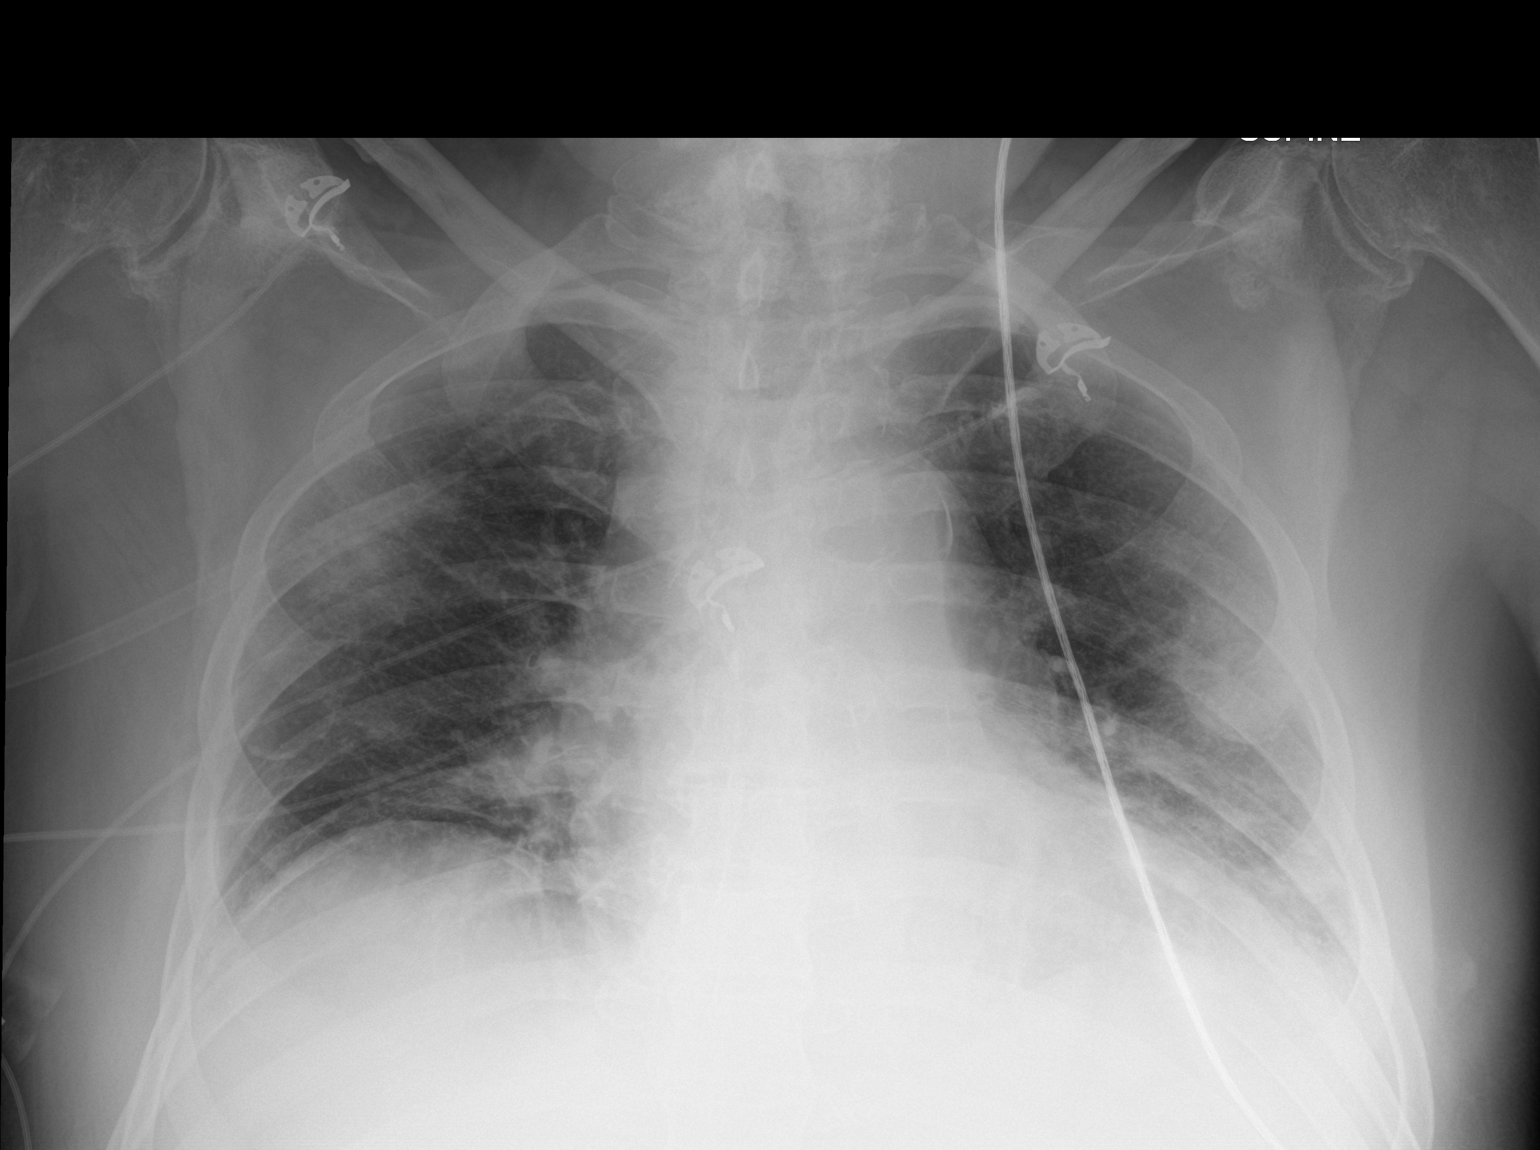

[1 of 1 positions shown; findings below may reference images not displayed]

FINDINGS: Stable cardiomediastinal silhouette. Patchy airspace opacities are
noted bilaterally consistent with multifocal pneumonia. No
pneumothorax pleural effusion is noted. Bony thorax is unremarkable.
IMPRESSION: Bilateral multifocal pneumonia.

## 2021-08-12 IMAGING — CT CT HEAD W/O CM
4 series · 16 of 47 positions shown, 18 images · non-contrast
Comparison: 12/19/2017

CLINICAL DATA: Mental status changes of unknown cause, nausea,
vomiting; history diabetes mellitus, hypertension

EXAM:
CT HEAD WITHOUT CONTRAST
TECHNIQUE: Contiguous axial images were obtained from the base of the skull
through the vertex without intravenous contrast. Sagittal and
coronal MPR images reconstructed from axial data set.

[Series 3: head without · axial · non-contrast · 0.46mm/px · z∈[-60,+75]mm · 7 of 37 slices shown, 9 images]
[im 5/37  brain]
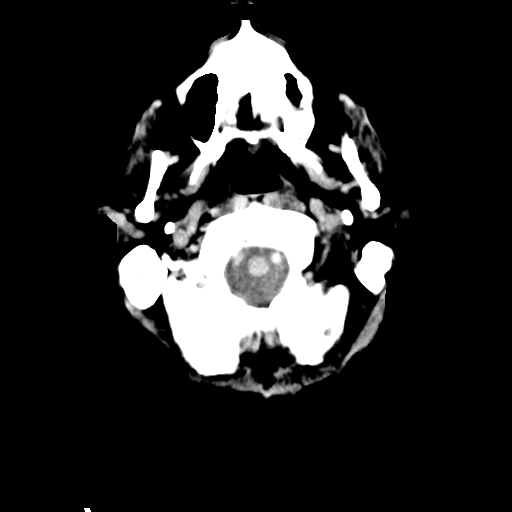
[im 5/37  bone]
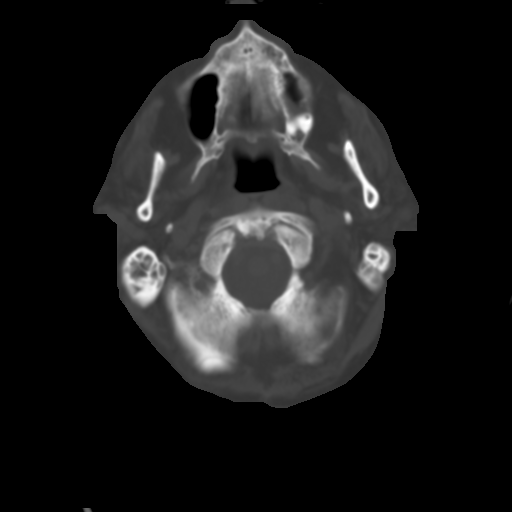
[im 10/37  brain]
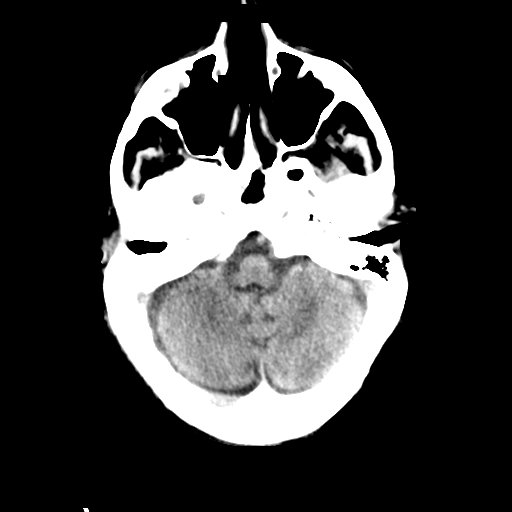
[im 14/37  brain]
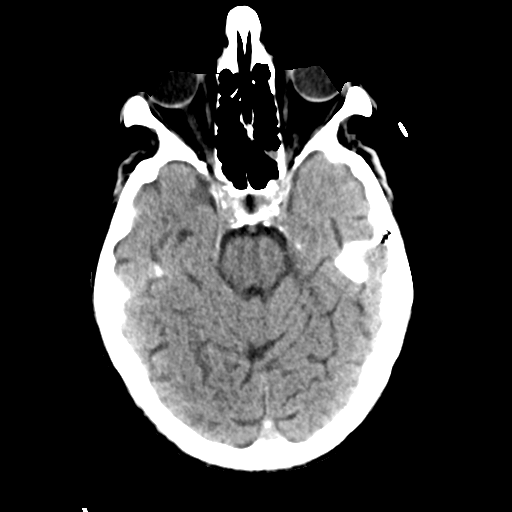
[im 19/37  brain]
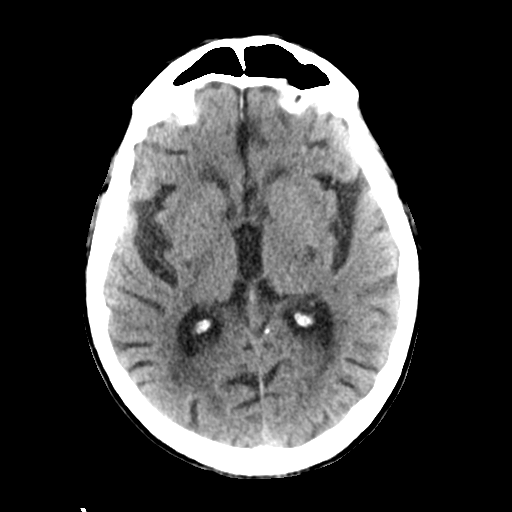
[im 23/37  brain]
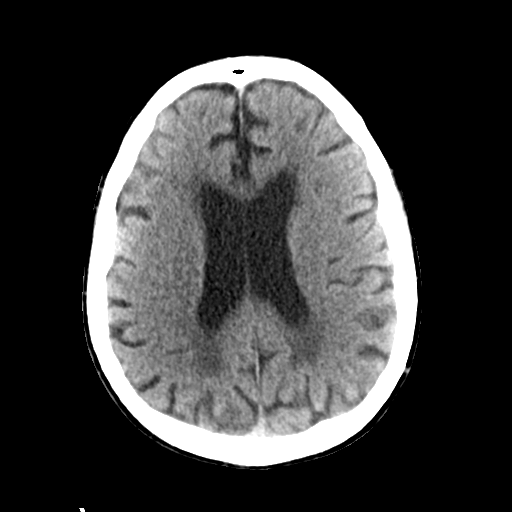
[im 23/37  bone]
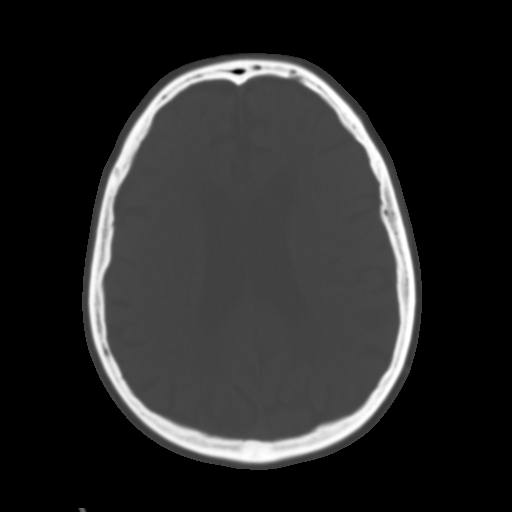
[im 28/37  brain]
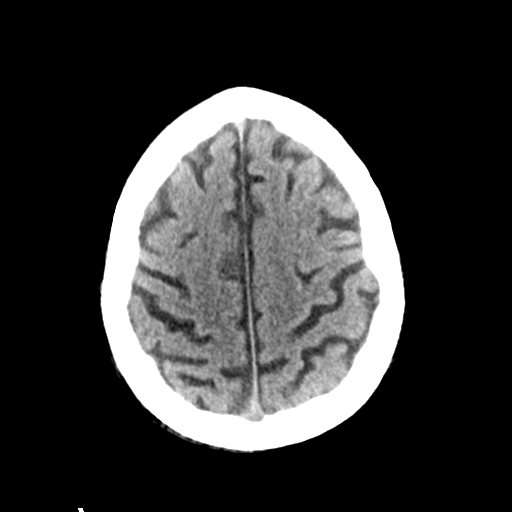
[im 32/37  brain]
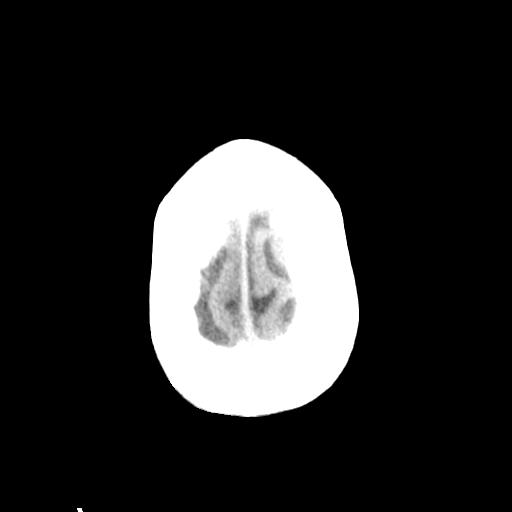

[Series 4: head bone · axial · 0.46mm/px · z∈[-62,-26]mm · 3 of 91 slices shown]
[im 10/91  bone]
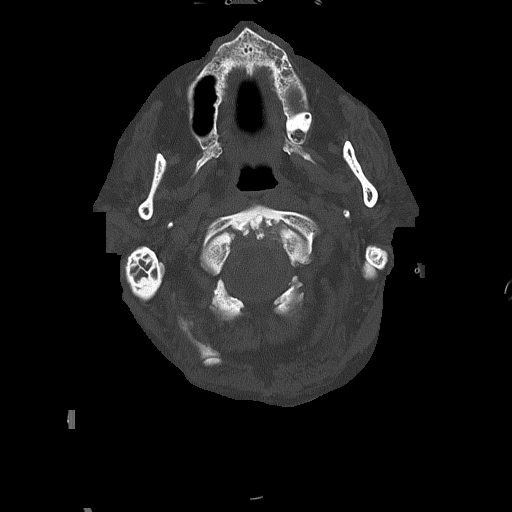
[im 19/91  bone]
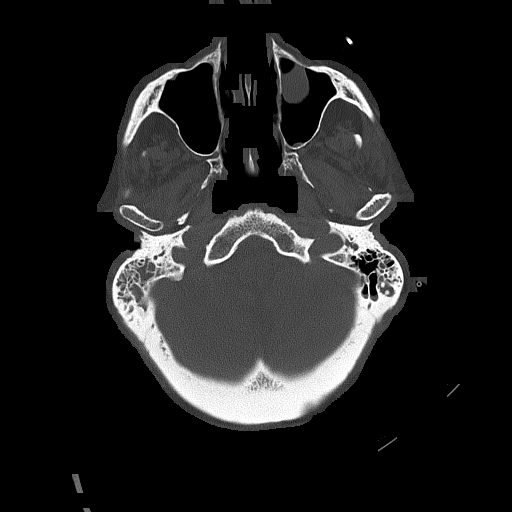
[im 28/91  bone]
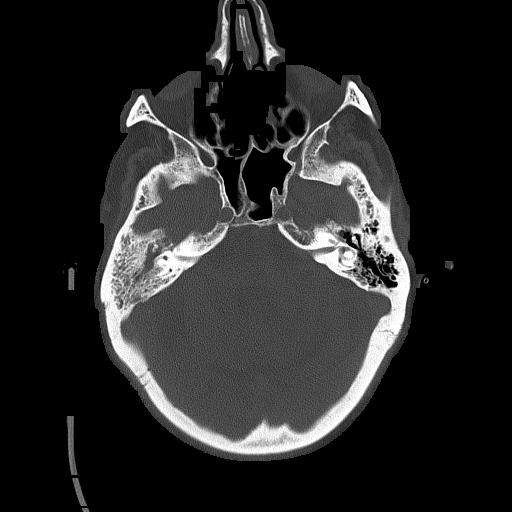

[Series 5: head without cor · coronal · non-contrast · 0.35mm/px · 3 of 78 slices shown]
[im 26/78  brain]
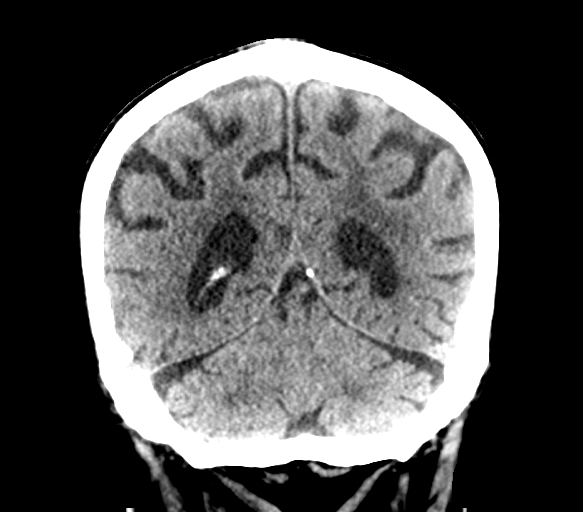
[im 35/78  brain]
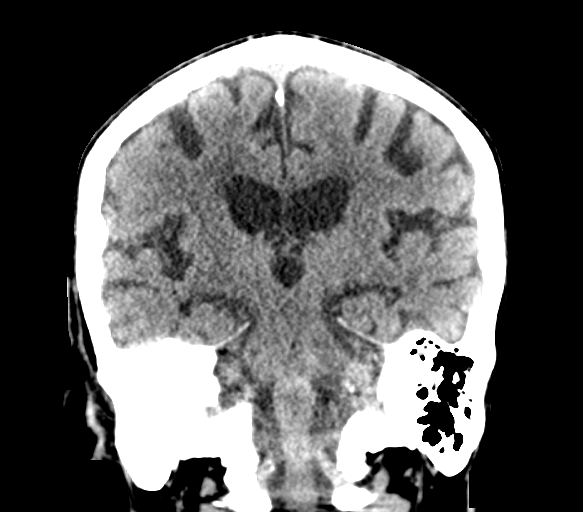
[im 43/78  brain]
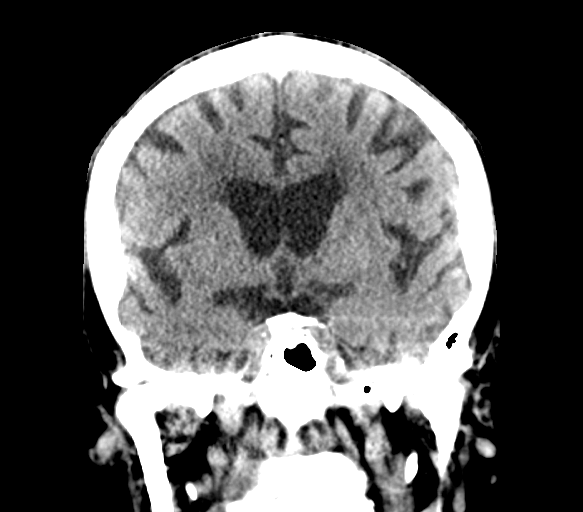

[Series 6: head without sag · sagittal · non-contrast · 0.35mm/px · 3 of 64 slices shown]
[im 22/64  brain]
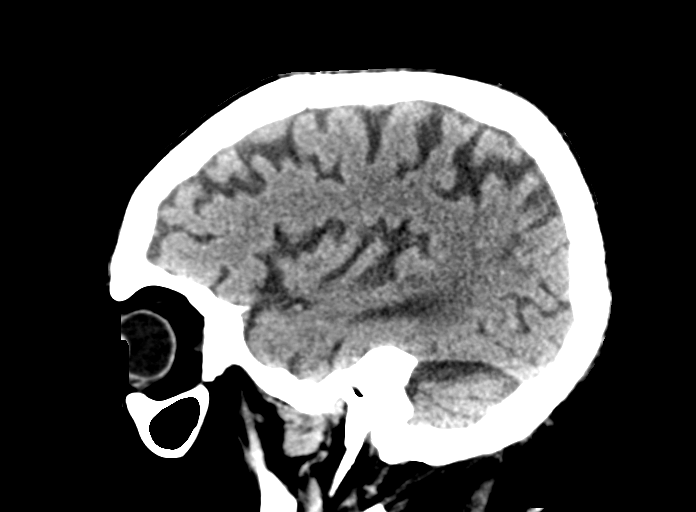
[im 32/64  brain]
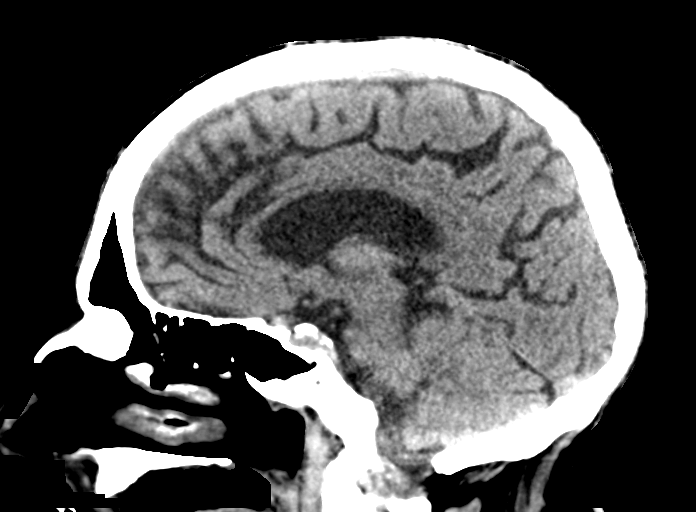
[im 43/64  brain]
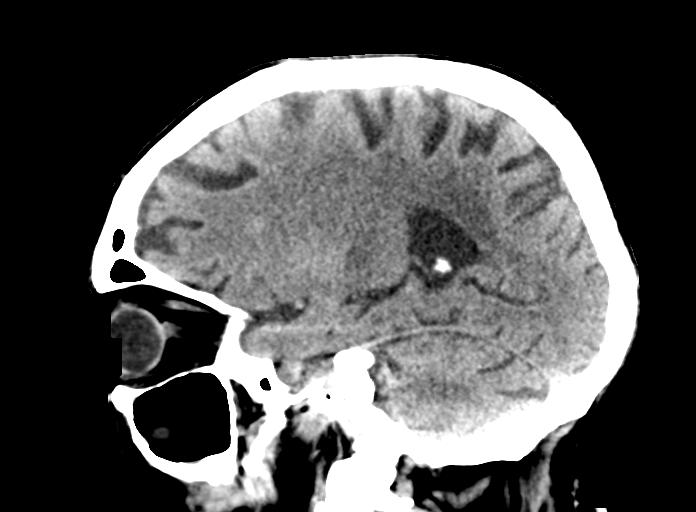

[16 of 47 positions shown; findings below may reference images not displayed]

FINDINGS: Brain: Generalized atrophy. Normal ventricular morphology. No
midline shift or mass effect. Small vessel chronic ischemic changes
of deep cerebral white matter. Old lacunar infarct at posterior LEFT
basal ganglia. No intracranial hemorrhage, mass lesion or evidence
of acute infarction. No extra-axial fluid collections.

Vascular: No hyperdense vessels. Minimal atherosclerotic
calcification of internal carotid arteries at skull base

Skull: Intact

Sinuses/Orbits: Complete opacification of RIGHT mastoid air cells
and middle ear cavity unchanged. Mucosal retention cyst LEFT
maxillary sinus. Small amount of fluid within a few inferior LEFT
mastoid air cells.

Other: N/A
IMPRESSION: Atrophy with small vessel chronic ischemic changes of deep cerebral
white matter.

Old lacunar infarct at posterior LEFT basal ganglia.

No acute intracranial abnormalities.

Chronic opacification of RIGHT mastoid air cells and middle ear
cavity.

## 2021-08-12 IMAGING — DX DG CHEST 1V PORT
1 series · 1 of 1 positions shown · non-contrast
Comparison: 03/07/2020

CLINICAL DATA: Syncope

EXAM:
PORTABLE CHEST 1 VIEW

[chest ap]
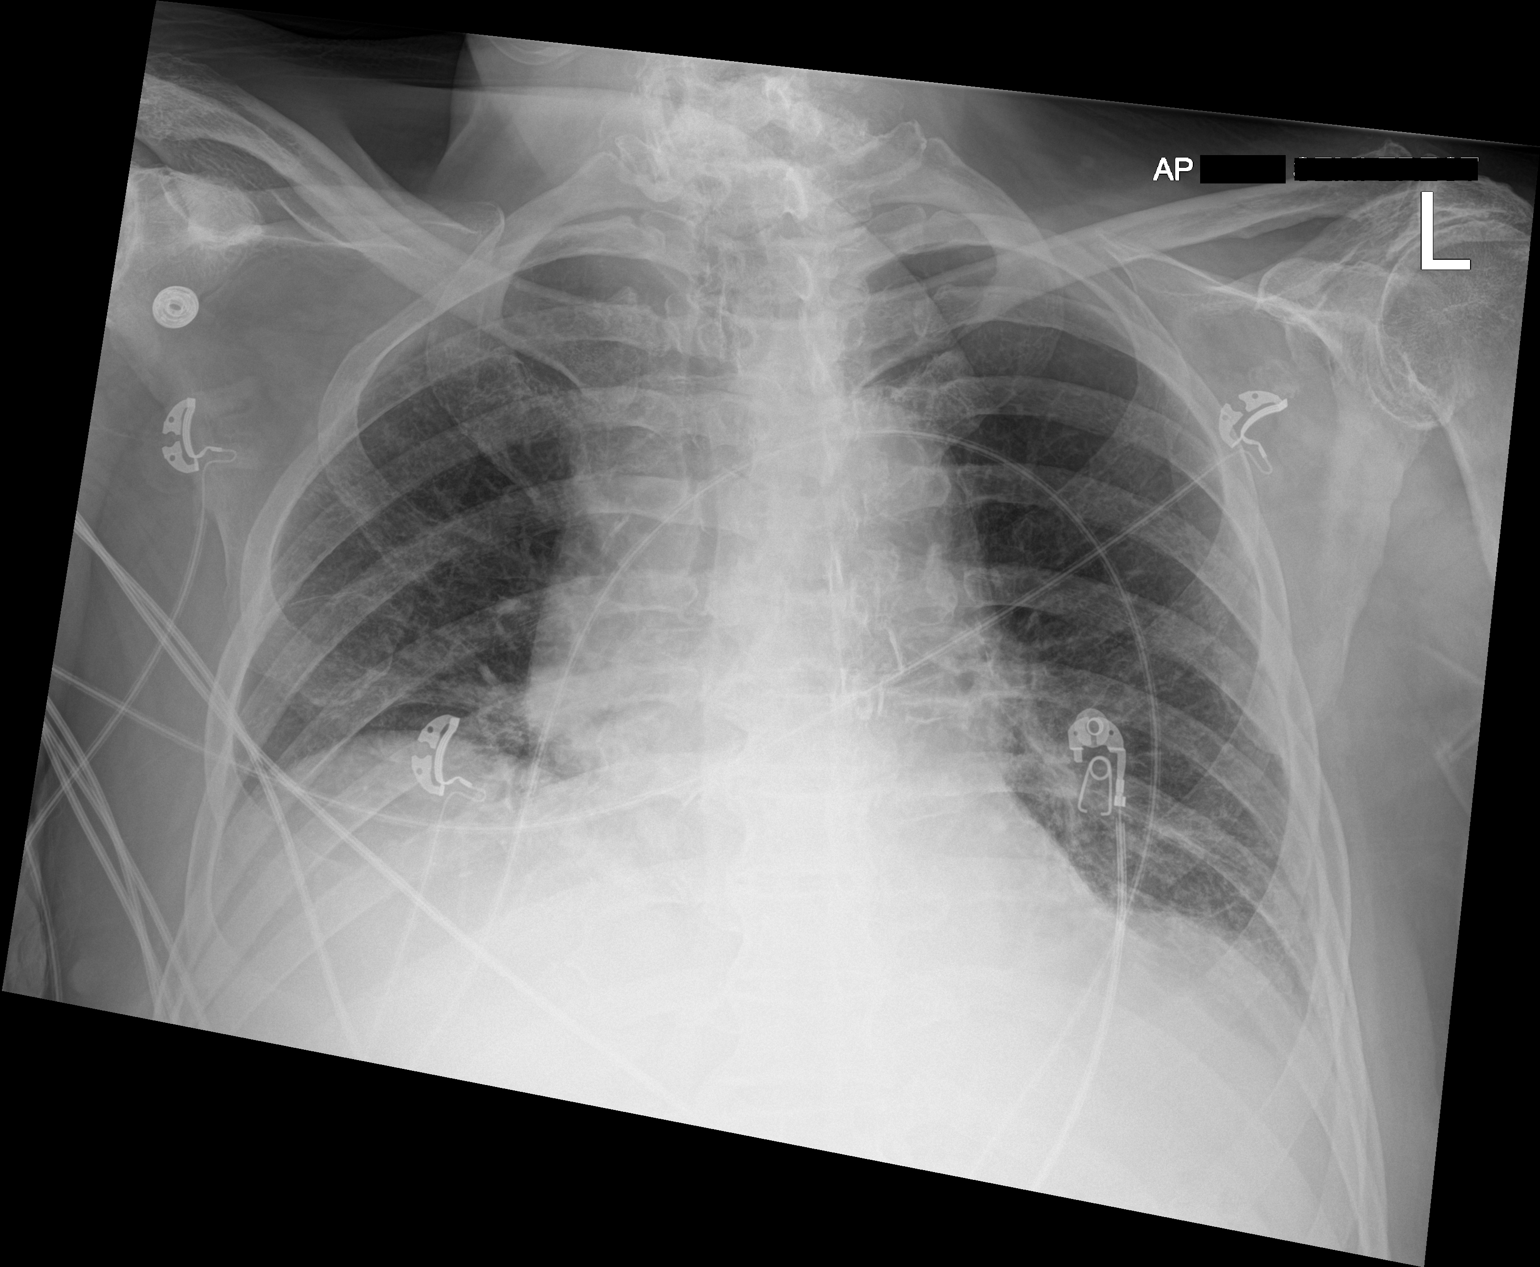

[1 of 1 positions shown; findings below may reference images not displayed]

FINDINGS: Improved aeration since prior with clearing of the patchy airspace
opacities bilaterally. The lungs remain low volume. Mild streaky
density at the bases seen since at least 8019. No edema, effusion,
or pneumothorax. Normal heart size and mediastinal contours.
IMPRESSION: Chronic low lung volumes with scarring/atelectasis at the bases.

## 2021-12-31 DEATH — deceased
